# Patient Record
Sex: Male | Born: 1970 | Race: Black or African American | Hispanic: No | Marital: Married | Smoking: Never smoker
Health system: Southern US, Community
[De-identification: ages and names within clinical notes are randomized; demographics above are authoritative.]

## PROBLEM LIST (undated history)

## (undated) DIAGNOSIS — T7840XA Allergy, unspecified, initial encounter: Secondary | ICD-10-CM

## (undated) DIAGNOSIS — G4730 Sleep apnea, unspecified: Secondary | ICD-10-CM

## (undated) HISTORY — PX: WISDOM TOOTH EXTRACTION: SHX21

## (undated) HISTORY — DX: Sleep apnea, unspecified: G47.30

## (undated) HISTORY — DX: Allergy, unspecified, initial encounter: T78.40XA

## (undated) HISTORY — PX: NASAL SEPTUM SURGERY: SHX37

---

## 2014-05-19 ENCOUNTER — Emergency Department (HOSPITAL_COMMUNITY)
Admission: EM | Admit: 2014-05-19 | Discharge: 2014-05-19 | Disposition: A | Discharge status: Home / Self Care | Payer: Federal, State, Local not specified - PPO | Attending: Emergency Medicine | Admitting: Emergency Medicine

## 2014-05-19 ENCOUNTER — Encounter (HOSPITAL_COMMUNITY): Payer: Self-pay | Admitting: *Deleted

## 2014-05-19 DIAGNOSIS — M545 Low back pain, unspecified: Secondary | ICD-10-CM

## 2014-05-19 DIAGNOSIS — M546 Pain in thoracic spine: Secondary | ICD-10-CM | POA: Diagnosis not present

## 2014-05-19 MED ORDER — KETOROLAC TROMETHAMINE 60 MG/2ML IM SOLN
60.0000 mg | Freq: Once | INTRAMUSCULAR | Status: AC
  Administered 2014-05-19: 60 mg via INTRAMUSCULAR
  Filled 2014-05-19: qty 2

## 2014-05-19 MED ORDER — HYDROCODONE-ACETAMINOPHEN 5-325 MG PO TABS
1.0000 | ORAL_TABLET | ORAL | Status: DC | PRN
Start: 2014-05-19 — End: 2014-07-14

## 2014-05-19 NOTE — ED Notes (Signed)
Pt reports a 2 day Hx of MID to lower back pain .

## 2014-05-19 NOTE — ED Notes (Signed)
Declined W/C at D/C and was escorted to lobby by RN. 

## 2014-05-19 NOTE — Discharge Instructions (Signed)
Return to the emergency room with worsening of symptoms, new symptoms or with symptoms that are concerning , especially fevers, loss of control of bladder or bowels, numbness or tingling around genital region or anus, weakness. RICE: Rest, Ice (three cycles of 20 mins on, 25mins off at least twice a day), compression/brace, elevation. Heating pad works well for back pain. Ibuprofen $RemoveBeforeD'400mg'[id number]$  (2 tablets $RemoveBe'200mg'cnzhQkqrX$ ) every 5-6 hours for 3-5 days. Norco for severe pain. Do not operate machinery, drive or drink alcohol while taking narcotics or muscle relaxers. Take at night. Follow up with orthopedist if symptoms worsen or are persistent. Call or make appointment with the wellness center to establish care and have recheck of high blood pressure. Read below information and follow recommendations. Back Injury Prevention Back injuries can be extremely painful and difficult to heal. After having one back injury, you are much more likely to experience another later on. It is important to learn how to avoid injuring or re-injuring your back. The following tips can help you to prevent a back injury. PHYSICAL FITNESS  Exercise regularly and try to develop good tone in your abdominal muscles. Your abdominal muscles provide a lot of the support needed by your back.  Do aerobic exercises (walking, jogging, biking, swimming) regularly.  Do exercises that increase balance and strength (tai chi, yoga) regularly. This can decrease your risk of falling and injuring your back.  Stretch before and after exercising.  Maintain a healthy weight. The more you weigh, the more stress is placed on your back. For every pound of weight, 10 times that amount of pressure is placed on the back. DIET  Talk to your caregiver about how much calcium and vitamin D you need per day. These nutrients help to prevent weakening of the bones (osteoporosis). Osteoporosis can cause broken (fractured) bones that lead to back pain.  Include good  sources of calcium in your diet, such as dairy products, green, leafy vegetables, and products with calcium added (fortified).  Include good sources of vitamin D in your diet, such as milk and foods that are fortified with vitamin D.  Consider taking a nutritional supplement or a multivitamin if needed.  Stop smoking if you smoke. POSTURE  Sit and stand up straight. Avoid leaning forward when you sit or hunching over when you stand.  Choose chairs with good low back (lumbar) support.  If you work at a desk, sit close to your work so you do not need to lean over. Keep your chin tucked in. Keep your neck drawn back and elbows bent at a right angle. Your arms should look like the letter "L."  Sit high and close to the steering wheel when you drive. Add a lumbar support to your car seat if needed.  Avoid sitting or standing in one position for too long. Take breaks to get up, stretch, and walk around at least once every hour. Take breaks if you are driving for long periods of time.  Sleep on your side with your knees slightly bent, or sleep on your back with a pillow under your knees. Do not sleep on your stomach. LIFTING, TWISTING, AND REACHING  Avoid heavy lifting, especially repetitive lifting. If you must do heavy lifting:  Stretch before lifting.  Work slowly.  Rest between lifts.  Use carts and dollies to move objects when possible.  Make several small trips instead of carrying 1 heavy load.  Ask for help when you need it.  Ask for help when moving big, awkward  objects.  Follow these steps when lifting:  Stand with your feet shoulder-width apart.  Get as close to the object as you can. Do not try to pick up heavy objects that are far from your body.  Use handles or lifting straps if they are available.  Bend at your knees. Squat down, but keep your heels off the floor.  Keep your shoulders pulled back, your chin tucked in, and your back straight.  Lift the object  slowly, tightening the muscles in your legs, abdomen, and buttocks. Keep the object as close to the center of your body as possible.  When you put a load down, use these same guidelines in reverse.  Do not:  Lift the object above your waist.  Twist at the waist while lifting or carrying a load. Move your feet if you need to turn, not your waist.  Bend over without bending at your knees.  Avoid reaching over your head, across a table, or for an object on a high surface. OTHER TIPS  Avoid wet floors and keep sidewalks clear of ice to prevent falls.  Do not sleep on a mattress that is too soft or too hard.  Keep items that are used frequently within easy reach.  Put heavier objects on shelves at waist level and lighter objects on lower or higher shelves.  Find ways to decrease your stress, such as exercise, massage, or relaxation techniques. Stress can build up in your muscles. Tense muscles are more vulnerable to injury.  Seek treatment for depression or anxiety if needed. These conditions can increase your risk of developing back pain. SEEK MEDICAL CARE IF:  You injure your back.  You have questions about diet, exercise, or other ways to prevent back injuries. MAKE SURE YOU:  Understand these instructions.  Will watch your condition.  Will get help right away if you are not doing well or get worse. Document Released: 03/24/2004 Document Revised: 05/09/2011 Document Reviewed: 03/28/2011 [facility] Patient Information 2015 [place], [place]. This information is not intended to replace advice given to you by your health care provider. Make sure you discuss any questions you have with your health care provider.

## 2014-05-19 NOTE — ED Provider Notes (Signed)
CSN: [id number]     Arrival date & time 05/19/14  5277 History   First MD Initiated Contact with Patient 05/19/14 [phone]     Chief Complaint  Patient presents with  . Back Pain     (Consider location/radiation/quality/duration/timing/severity/associated sxs/prior Treatment) HPI  Bryan Allen is a 44 y.o. male presenting with 2 day history of bilateral mid to lower sharp back pain that exacerbated with bending over. He has been taking ibuprofen without significant relief. She reports new job with the [employer] take things could be upsetting his back. He has never had back pain before. He denies any urinary or abdominal pain. No fevers, chills, night sweats, weight loss, IVDU, history of malignancy. No loss of control of bladder or bowel. No numbness/tingling, weakness or saddle anesthesia.     History reviewed. No pertinent past medical history. History reviewed. No pertinent past surgical history. History reviewed. No pertinent family history. History  Substance Use Topics  . Smoking status: Never Smoker   . Smokeless tobacco: Never Used  . Alcohol Use: No    Review of Systems  Constitutional: Negative for fever and chills.  Gastrointestinal: Negative for nausea and vomiting.  Genitourinary: Negative for dysuria, urgency, frequency and decreased urine volume.  Musculoskeletal: Positive for back pain. Negative for gait problem.  Neurological: Negative for weakness and numbness.      Allergies  Review of patient's allergies indicates not on file.  Home Medications   Prior to Admission medications   Not on File   BP 143/100 mmHg  Pulse 72  Temp(Src) 97.6 F (36.4 C) (Oral)  Resp 18  Ht 5\' 8"  (1.727 m)  Wt 236 lb (107.049 kg)  BMI 35.89 kg/m2  SpO2 96% Physical Exam  Constitutional: He appears well-developed and well-nourished. No distress.  HENT:  Head: Normocephalic and atraumatic.  Eyes: Conjunctivae are normal. Right eye exhibits no discharge. Left eye  exhibits no discharge.  Cardiovascular: Normal rate, regular rhythm and normal heart sounds.   Pulmonary/Chest: Effort normal and breath sounds normal. No respiratory distress. He has no wheezes.  Abdominal: Soft. Bowel sounds are normal. He exhibits no distension. There is no tenderness.  Musculoskeletal:  No midline back tenderness, step off or crepitus. Right and Left sided lower and mid thoracic back tenderness. No CVA tenderness.  Neurological: He is alert. Coordination normal.  Equal muscle tone. 5/5 strength in lower extremities. DTR equal and intact. Negative straight leg test. Normal gait.  Skin: Skin is warm and dry. He is not diaphoretic.  Nursing note and vitals reviewed.   ED Course  Procedures (including critical care time) Labs Review Labs Reviewed - No data to display  Imaging Review No results found.   EKG Interpretation None      MDM   Final diagnoses:  None   Patient presenting with 2 day history of sharp back pain exacerbated with movement after new job with the [employer]. VSS. Normal neurological exam and patient ambulatory. At presentation and exam consistent with cauda equina. Patient encouraged to use ibuprofen rice protocol as well as Norco prescription. Driving and sedation precautions provided. Patient also with initial hypertension. Likely related to his pain. He's been given a referral to wellness center to establish care and recheck for blood pressure.  Discussed return precautions with patient. Discussed all results and patient verbalizes understanding and agrees with plan.     Al Corpus, PA-C 05/19/14 3536  Evelina Bucy, MD 05/19/14 0900

## 2014-07-14 ENCOUNTER — Encounter: Payer: Self-pay | Admitting: Medical

## 2014-07-14 ENCOUNTER — Ambulatory Visit (INDEPENDENT_AMBULATORY_CARE_PROVIDER_SITE_OTHER): Payer: Federal, State, Local not specified - PPO | Admitting: Medical

## 2014-07-14 VITALS — BP 118/76 | HR 71 | Temp 98.5°F | Ht 67.75 in | Wt 230.2 lb

## 2014-07-14 DIAGNOSIS — Z125 Encounter for screening for malignant neoplasm of prostate: Secondary | ICD-10-CM

## 2014-07-14 DIAGNOSIS — Z8042 Family history of malignant neoplasm of prostate: Secondary | ICD-10-CM

## 2014-07-14 DIAGNOSIS — Z23 Encounter for immunization: Secondary | ICD-10-CM

## 2014-07-14 DIAGNOSIS — Z0000 Encounter for general adult medical examination without abnormal findings: Secondary | ICD-10-CM | POA: Diagnosis not present

## 2014-07-14 LAB — CBC
HCT: 43 % (ref 39.0–52.0)
Hemoglobin: 15.2 g/dL (ref 13.0–17.0)
MCH: 29.5 pg (ref 26.0–34.0)
MCHC: 35.3 g/dL (ref 30.0–36.0)
MCV: 83.3 fL (ref 78.0–100.0)
MPV: 10.1 fL (ref 8.6–12.4)
Platelets: 192 10*3/uL (ref 150–400)
RBC: 5.16 MIL/uL (ref 4.22–5.81)
RDW: 14.2 % (ref 11.5–15.5)
WBC: 5 10*3/uL (ref 4.0–10.5)

## 2014-07-14 LAB — COMPREHENSIVE METABOLIC PANEL
ALT: 71 U/L — AB (ref 0–53)
AST: 33 U/L (ref 0–37)
Albumin: 4.7 g/dL (ref 3.5–5.2)
Alkaline Phosphatase: 49 U/L (ref 39–117)
BILIRUBIN TOTAL: 0.4 mg/dL (ref 0.2–1.2)
BUN: 17 mg/dL (ref 6–23)
CO2: 25 mEq/L (ref 19–32)
Calcium: 9.7 mg/dL (ref 8.4–10.5)
Chloride: 105 mEq/L (ref 96–112)
Creat: 1.14 mg/dL (ref 0.50–1.35)
Glucose, Bld: 92 mg/dL (ref 70–99)
POTASSIUM: 4.4 meq/L (ref 3.5–5.3)
SODIUM: 141 meq/L (ref 135–145)
Total Protein: 7.6 g/dL (ref 6.0–8.3)

## 2014-07-14 LAB — LIPID PANEL
Cholesterol: 186 mg/dL (ref 0–200)
HDL: 35 mg/dL — AB (ref >=40)
LDL CALC: 130 mg/dL — AB (ref 0–99)
TRIGLYCERIDES: 104 mg/dL (ref <150)
Total CHOL/HDL Ratio: 5.3 Ratio
VLDL: 21 mg/dL (ref 0–40)

## 2014-07-14 LAB — HEMOGLOBIN A1C
Hgb A1c MFr Bld: 5.8 % — ABNORMAL HIGH (ref <5.7)
Mean Plasma Glucose: 120 mg/dL — ABNORMAL HIGH (ref <117)

## 2014-07-14 NOTE — Progress Notes (Signed)
Subjective:   HPI  Bryan Allen is a 44 y.o. male who presents for a complete physical.  Just moved from [place] to [facility] in February    Preventative care: Last ophthalmology visit:  2 weeks ago Last dental visit: a year ago Last colonoscopy: never Last prostate exam: 2 years ago Last EKG: never Last labs: a year ago  Prior vaccinations: TD or Tdap: unsure Influenza: never Pneumococcal: never  Advanced directive: Health care power of attorney: no Living will: no  Reviewed their medical, surgical, family, social, medication, and allergy history and updated chart as appropriate.  No past medical history on file.  Past Surgical History  Procedure Laterality Date  . Nasal septum surgery      History   Social History  . Marital Status: Married    Spouse Name: N/A  . Number of Children: N/A  . Years of Education: N/A   Occupational History  . Not on file.   Social History Main Topics  . Smoking status: Never Smoker   . Smokeless tobacco: Never Used  . Alcohol Use: No  . Drug Use: No  . Sexual Activity: Not on file   Other Topics Concern  . 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  Wants to start riding back and forth to work.    Family History  Problem Relation Age of Onset  . Cancer Mother     unsure of type  . Pneumonia Father 83    died of pneumonia  . Cancer Father     prostate in 67s  . Other Sister     died in [place]  . Heart disease Brother     congenital   . Asthma Daughter   . Asthma Son   . Diabetes Neg Hx   . Stroke Neg Hx     No current outpatient prescriptions on file.  No Known Allergies   Review of Systems Constitutional: -fever, -chills, -sweats, -unexpected weight change, -decreased appetite, -fatigue Allergy: -sneezing, -itching, -congestion Dermatology: -changing moles, --rash, -lumps ENT:  -runny nose, -ear pain, -sore throat, -hoarseness, -sinus pain, -teeth pain, - ringing in ears, -hearing loss, -nosebleeds Cardiology: -chest pain, -palpitations, -swelling, -difficulty breathing when lying flat, -waking up short of breath Respiratory: -cough, -shortness of breath, -difficulty breathing with exercise or exertion, -wheezing, -coughing up blood Gastroenterology: -abdominal pain, -nausea, -vomiting, -diarrhea, -constipation, -blood in stool, -changes in bowel movement, -difficulty swallowing or eating Hematology: -bleeding, -bruising  Musculoskeletal: -joint aches, -muscle aches, -joint swelling, -back pain, -neck pain, -cramping, -changes in gait Ophthalmology: denies vision changes, eye redness, itching, discharge Urology: -burning with urination, -difficulty urinating, -blood in urine, -urinary frequency, -urgency, -incontinence Neurology: -headache, -weakness, -tingling, -numbness, -memory loss, -falls, -dizziness Psychology: -depressed mood, -agitation, -sleep problems     Objective:   Physical Exam  BP 118/76 mmHg  Pulse 71  Temp(Src) 98.5 F (36.9 C) (Oral)  Ht 5' 7.75" (1.721 m)  Wt 230 lb 3.2 oz (104.418 kg)  BMI 35.25 kg/m2  General appearance: alert, no distress, WD/WN, AA male, muscular build Skin:left upper back with brown raised raisin appearing lesion suggestive of seb derm, otherwise few scattered benign macules, no worrisome lesions HEENT: normocephalic, conjunctiva/corneas normal, sclerae anicteric, PERRLA, EOMi, nares patent, no discharge or erythema, pharynx normal Oral cavity: MMM, tongue normal, teeth in good repair Neck: supple, no lymphadenopathy, no thyromegaly, no masses,  normal ROM, no bruits Chest: non tender, normal shape and expansion Heart: RRR, normal S1, S2, no murmurs Lungs: CTA bilaterally, no wheezes, rhonchi, or rales Abdomen: +bs, soft, non tender, non distended, no masses, no hepatomegaly, no splenomegaly, no bruits Back: non  tender, normal ROM, no scoliosis Musculoskeletal: upper extremities non tender, no obvious deformity, normal ROM throughout, lower extremities non tender, no obvious deformity, normal ROM throughout Extremities: no edema, no cyanosis, no clubbing Pulses: 2+ symmetric, upper and lower extremities, normal cap refill Neurological: alert, oriented x 3, CN2-12 intact, strength normal upper extremities and lower extremities, sensation normal throughout, DTRs 2+ throughout, no cerebellar signs, gait normal Psychiatric: normal affect, behavior normal, pleasant  GU: normal male external genitalia, circumcised, nontender, no masses, no hernia, no lymphadenopathy Rectal: anus normal tone, prosate WNL   Assessment and Plan :    Encounter Diagnoses  Name Primary?  . Encounter for health maintenance examination in adult Yes  . Screening for prostate cancer   . Family history of prostate cancer   . Need for Tdap vaccination     Physical exam - discussed healthy lifestyle, diet, exercise, preventative care, vaccinations, and addressed their concerns.   See your eye doctor yearly for routine vision care. See your dentist yearly for routine dental care including hygiene visits twice yearly. discussed risks/benefits of prostate screening.    Counseled on the Tdap (tetanus, diptheria, and acellular pertussis) vaccine.  Vaccine information sheet given. Tdap vaccine given after consent obtained. Follow-up pending labs

## 2014-07-14 NOTE — Patient Instructions (Signed)
Thank you for giving me the opportunity to serve you today.   Your diagnoses today includes: Encounter Diagnoses  Name Primary?  . Encounter for health maintenance examination in adult Yes  . Screening for prostate cancer   . Family history of prostate cancer   . Need for Tdap vaccination     Specific recommendations today include Exercise most days per week, preferably at least 30 minutes every day Eat healthy, work on some weight loss  Vaccine recommendations: Yearly flu vaccine We updated your Tdap - tetanus, diptheria and pertussis vaccine today  Please follow up pending labs  I have included other useful information below for your review.  Dr. Jonna Coup, dentist [address] [place], [place] [address] [phone] [email or web address]      Preventative Care for Adults, Male       REGULAR HEALTH EXAMS:  A routine yearly physical is a good way to check in with your primary care provider about your health and preventive screening. It is also an opportunity to share updates about your health and any concerns you have, and receive a thorough all-over exam.   Most health insurance companies pay for at least some preventative services.  Check with your health plan for specific coverages.  WHAT PREVENTATIVE SERVICES DO MEN NEED?  Adult men should have their weight and blood pressure checked regularly.   Men age 25 and older should have their cholesterol levels checked regularly.  Beginning at age 7 and continuing to age 53, men should be screened for colorectal cancer.  Certain people should may need continued testing until age 24.  Other cancer screening may include exams for testicular and prostate cancer.  Updating vaccinations is part of preventative care.  Vaccinations help protect against diseases such as the flu.  Lab tests are generally done as part of preventative care to screen for anemia and blood disorders, to screen for problems with the kidneys and  liver, to screen for bladder problems, to check blood sugar, and to check your cholesterol level.  Preventative services generally include counseling about diet, exercise, avoiding tobacco, drugs, excessive alcohol consumption, and sexually transmitted infections.    GENERAL RECOMMENDATIONS FOR GOOD HEALTH:  Healthy diet:  Eat a variety of foods, including fruit, vegetables, animal or vegetable protein, such as meat, fish, chicken, and eggs, or beans, lentils, tofu, and grains, such as rice.  Drink plenty of water daily.  Decrease saturated fat in the diet, avoid lots of red meat, processed foods, sweets, fast foods, and fried foods.  Exercise:  Aerobic exercise helps maintain good heart health. At least 30-40 minutes of moderate-intensity exercise is recommended. For example, a brisk walk that increases your heart rate and breathing. This should be done on most days of the week.   Find a type of exercise or a variety of exercises that you enjoy so that it becomes a part of your daily life.  Examples are running, walking, swimming, water aerobics, and biking.  For motivation and support, explore group exercise such as aerobic class, spin class, Zumba, Yoga,or  martial arts, etc.    Set exercise goals for yourself, such as a certain weight goal, walk or run in a race such as a 5k walk/run.  Speak to your primary care provider about exercise goals.  Disease prevention:  If you smoke or chew tobacco, find out from your caregiver how to quit. It can literally save your life, no matter how long you have been a tobacco user. If you  do not use tobacco, never begin.   Maintain a healthy diet and normal weight. Increased weight leads to problems with blood pressure and diabetes.   The Body Mass Index or BMI is a way of measuring how much of your body is fat. Having a BMI above 27 increases the risk of heart disease, diabetes, hypertension, stroke and other problems related to obesity. Your  caregiver can help determine your BMI and based on it develop an exercise and dietary program to help you achieve or maintain this important measurement at a healthful level.  High blood pressure causes heart and blood vessel problems.  Persistent high blood pressure should be treated with medicine if weight loss and exercise do not work.   Fat and cholesterol leaves deposits in your arteries that can block them. This causes heart disease and vessel disease elsewhere in your body.  If your cholesterol is found to be high, or if you have heart disease or certain other medical conditions, then you may need to have your cholesterol monitored frequently and be treated with medication.   Ask if you should have a stress test if your history suggests this. A stress test is a test done on a treadmill that looks for heart disease. This test can find disease prior to there being a problem.  Avoid drinking alcohol in excess (more than two drinks per day).  Avoid use of street drugs. Do not share needles with anyone. Ask for professional help if you need assistance or instructions on stopping the use of alcohol, cigarettes, and/or drugs.  Brush your teeth twice a day with fluoride toothpaste, and floss once a day. Good oral hygiene prevents tooth decay and gum disease. The problems can be painful, unattractive, and can cause other health problems. Visit your dentist for a routine oral and dental check up and preventive care every 6-12 months.   Look at your skin regularly.  Use a mirror to look at your back. Notify your caregivers of changes in moles, especially if there are changes in shapes, colors, a size larger than a pencil eraser, an irregular border, or development of new moles.  Safety:  Use seatbelts 100% of the time, whether driving or as a passenger.  Use safety devices such as hearing protection if you work in environments with loud noise or significant background noise.  Use safety glasses when  doing any work that could send debris in to the eyes.  Use a helmet if you ride a bike or motorcycle.  Use appropriate safety gear for contact sports.  Talk to your caregiver about gun safety.  Use sunscreen with a SPF (or skin protection factor) of 15 or greater.  Lighter skinned people are at a greater risk of skin cancer. Don't forget to also wear sunglasses in order to protect your eyes from too much damaging sunlight. Damaging sunlight can accelerate cataract formation.   Practice safe sex. Use condoms. Condoms are used for birth control and to help reduce the spread of sexually transmitted infections (or STIs).  Some of the STIs are gonorrhea (the clap), chlamydia, syphilis, trichomonas, herpes, HPV (human papilloma virus) and HIV (human immunodeficiency virus) which causes AIDS. The herpes, HIV and HPV are viral illnesses that have no cure. These can result in disability, cancer and death.   Keep carbon monoxide and smoke detectors in your home functioning at all times. Change the batteries every 6 months or use a model that plugs into the wall.   Vaccinations:  Stay  up to date with your tetanus shots and other required immunizations. You should have a booster for tetanus every 10 years. Be sure to get your flu shot every year, since 5%-20% of the [place] population comes down with the flu. The flu vaccine changes each year, so being vaccinated once is not enough. Get your shot in the fall, before the flu season peaks.   Other vaccines to consider:  Pneumococcal vaccine to protect against certain types of pneumonia.  This is normally recommended for adults age 12 or older.  However, adults younger than 45 years old with certain underlying conditions such as diabetes, heart or lung disease should also receive the vaccine.  Shingles vaccine to protect against Varicella Zoster if you are older than age 61, or younger than 45 years old with certain underlying illness.  Hepatitis A vaccine to protect  against a form of infection of the liver by a virus acquired from food.  Hepatitis B vaccine to protect against a form of infection of the liver by a virus acquired from blood or body fluids, particularly if you work in health care.  If you plan to travel internationally, check with your local health department for specific vaccination recommendations.  Cancer Screening:  Most routine colon cancer screening begins at the age of 75. On a yearly basis, doctors may provide special easy to use take-home tests to check for hidden blood in the stool. Sigmoidoscopy or colonoscopy can detect the earliest forms of colon cancer and is life saving. These tests use a small camera at the end of a tube to directly examine the colon. Speak to your caregiver about this at age 22, when routine screening begins (and is repeated every 5 years unless early forms of pre-cancerous polyps or small growths are found).   At the age of 95 men usually start screening for prostate cancer every year. Screening may begin at a younger age for those with higher risk. Those at higher risk include African-Americans or having a family history of prostate cancer. There are two types of tests for prostate cancer:   Prostate-specific antigen (PSA) testing. Recent studies raise questions about prostate cancer using PSA and you should discuss this with your caregiver.   Digital rectal exam (in which your doctor's lubricated and gloved finger feels for enlargement of the prostate through the anus).   Screening for testicular cancer.  Do a monthly exam of your testicles. Gently roll each testicle between your thumb and fingers, feeling for any abnormal lumps. The best time to do this is after a hot shower or bath when the tissues are looser. Notify your caregivers of any lumps, tenderness or changes in size or shape immediately.

## 2014-07-15 LAB — PSA: PSA: 0.59 ng/mL (ref <=4.00)

## 2014-08-14 ENCOUNTER — Ambulatory Visit (INDEPENDENT_AMBULATORY_CARE_PROVIDER_SITE_OTHER): Payer: Federal, State, Local not specified - PPO | Admitting: Medical

## 2014-08-14 ENCOUNTER — Encounter: Payer: Self-pay | Admitting: Medical

## 2014-08-14 VITALS — BP 134/94 | HR 80 | Resp 16 | Wt 218.0 lb

## 2014-08-14 DIAGNOSIS — I10 Essential (primary) hypertension: Secondary | ICD-10-CM | POA: Diagnosis not present

## 2014-08-14 NOTE — Patient Instructions (Signed)
DASH Eating Plan DASH stands for "Dietary Approaches to Stop Hypertension." The DASH eating plan is a healthy eating plan that has been shown to reduce high blood pressure (hypertension). Additional health benefits may include reducing the risk of type 2 diabetes mellitus, heart disease, and stroke. The DASH eating plan may also help with weight loss. WHAT DO I NEED TO KNOW ABOUT THE DASH EATING PLAN? For the DASH eating plan, you will follow these general guidelines:  Choose foods with a percent daily value for sodium of less than 5% (as listed on the food label).  Use salt-free seasonings or herbs instead of table salt or sea salt.  Check with your health care provider or pharmacist before using salt substitutes.  Eat lower-sodium products, often labeled as "lower sodium" or "no salt added."  Eat fresh foods.  Eat more vegetables, fruits, and low-fat dairy products.  Choose whole grains. Look for the word "whole" as the first word in the ingredient list.  Choose fish and skinless chicken or turkey more often than red meat. Limit fish, poultry, and meat to 6 oz (170 g) each day.  Limit sweets, desserts, sugars, and sugary drinks.  Choose heart-healthy fats.  Limit cheese to 1 oz (28 g) per day.  Eat more home-cooked food and less restaurant, buffet, and fast food.  Limit fried foods.  Cook foods using methods other than frying.  Limit canned vegetables. If you do use them, rinse them well to decrease the sodium.  When eating at a restaurant, ask that your food be prepared with less salt, or no salt if possible. WHAT FOODS CAN I EAT? Seek help from a dietitian for individual calorie needs. Grains Whole grain or whole wheat bread. Brown rice. Whole grain or whole wheat pasta. Quinoa, bulgur, and whole grain cereals. Low-sodium cereals. Corn or whole wheat flour tortillas. Whole grain cornbread. Whole grain crackers. Low-sodium crackers. Vegetables Fresh or frozen vegetables  (raw, steamed, roasted, or grilled). Low-sodium or reduced-sodium tomato and vegetable juices. Low-sodium or reduced-sodium tomato sauce and paste. Low-sodium or reduced-sodium canned vegetables.  Fruits All fresh, canned (in natural juice), or frozen fruits. Meat and Other Protein Products Ground beef (85% or leaner), grass-fed beef, or beef trimmed of fat. Skinless chicken or turkey. Ground chicken or turkey. Pork trimmed of fat. All fish and seafood. Eggs. Dried beans, peas, or lentils. Unsalted nuts and seeds. Unsalted canned beans. Dairy Low-fat dairy products, such as skim or 1% milk, 2% or reduced-fat cheeses, low-fat ricotta or cottage cheese, or plain low-fat yogurt. Low-sodium or reduced-sodium cheeses. Fats and Oils Tub margarines without trans fats. Light or reduced-fat mayonnaise and salad dressings (reduced sodium). Avocado. Safflower, olive, or canola oils. Natural peanut or almond butter. Other Unsalted popcorn and pretzels. The items listed above may not be a complete list of recommended foods or beverages. Contact your dietitian for more options. WHAT FOODS ARE NOT RECOMMENDED? Grains White bread. White pasta. White rice. Refined cornbread. Bagels and croissants. Crackers that contain trans fat. Vegetables Creamed or fried vegetables. Vegetables in a cheese sauce. Regular canned vegetables. Regular canned tomato sauce and paste. Regular tomato and vegetable juices. Fruits Dried fruits. Canned fruit in light or heavy syrup. Fruit juice. Meat and Other Protein Products Fatty cuts of meat. Ribs, chicken wings, bacon, sausage, bologna, salami, chitterlings, fatback, hot dogs, bratwurst, and packaged luncheon meats. Salted nuts and seeds. Canned beans with salt. Dairy Whole or 2% milk, cream, half-and-half, and cream cheese. Whole-fat or sweetened yogurt. Full-fat   cheeses or blue cheese. Nondairy creamers and whipped toppings. Processed cheese, cheese spreads, or cheese  curds. Condiments Onion and garlic salt, seasoned salt, table salt, and sea salt. Canned and packaged gravies. Worcestershire sauce. Tartar sauce. Barbecue sauce. Teriyaki sauce. Soy sauce, including reduced sodium. Steak sauce. Fish sauce. Oyster sauce. Cocktail sauce. Horseradish. Ketchup and mustard. Meat flavorings and tenderizers. Bouillon cubes. Hot sauce. Tabasco sauce. Marinades. Taco seasonings. Relishes. Fats and Oils Butter, stick margarine, lard, shortening, ghee, and bacon fat. Coconut, palm kernel, or palm oils. Regular salad dressings. Other Pickles and olives. Salted popcorn and pretzels. The items listed above may not be a complete list of foods and beverages to avoid. Contact your dietitian for more information. WHERE CAN I FIND MORE INFORMATION? National Heart, Lung, and Blood Institute: www.nhlbi.nih.gov/health/health-topics/topics/dash/ Document Released: 02/03/2011 Document Revised: 07/01/2013 Document Reviewed: 12/19/2012 ExitCare Patient Information 2015 [employer]. This information is not intended to replace advice given to you by your health care provider. Make sure you discuss any questions you have with your health care provider. Hypertension Hypertension, commonly called high blood pressure, is when the force of blood pumping through your arteries is too strong. Your arteries are the blood vessels that carry blood from your heart throughout your body. A blood pressure reading consists of a higher number over a lower number, such as 110/72. The higher number (systolic) is the pressure inside your arteries when your heart pumps. The lower number (diastolic) is the pressure inside your arteries when your heart relaxes. Ideally you want your blood pressure below 120/80. Hypertension forces your heart to work harder to pump blood. Your arteries may become narrow or stiff. Having hypertension puts you at risk for heart disease, stroke, and other problems.  RISK  FACTORS Some risk factors for high blood pressure are controllable. Others are not.  Risk factors you cannot control include:   Race. You may be at higher risk if you are African American.  Age. Risk increases with age.  Gender. Men are at higher risk than women before age 45 years. After age 65, women are at higher risk than men. Risk factors you can control include:  Not getting enough exercise or physical activity.  Being overweight.  Getting too much fat, sugar, calories, or salt in your diet.  Drinking too much alcohol. SIGNS AND SYMPTOMS Hypertension does not usually cause signs or symptoms. Extremely high blood pressure (hypertensive crisis) may cause headache, anxiety, shortness of breath, and nosebleed. DIAGNOSIS  To check if you have hypertension, your health care provider will measure your blood pressure while you are seated, with your arm held at the level of your heart. It should be measured at least twice using the same arm. Certain conditions can cause a difference in blood pressure between your right and left arms. A blood pressure reading that is higher than normal on one occasion does not mean that you need treatment. If one blood pressure reading is high, ask your health care provider about having it checked again. TREATMENT  Treating high blood pressure includes making lifestyle changes and possibly taking medicine. Living a healthy lifestyle can help lower high blood pressure. You may need to change some of your habits. Lifestyle changes may include:  Following the DASH diet. This diet is high in fruits, vegetables, and whole grains. It is low in salt, red meat, and added sugars.  Getting at least 2 hours of brisk physical activity every week.  Losing weight if necessary.  Not smoking.  Limiting   alcoholic beverages.  Learning ways to reduce stress. If lifestyle changes are not enough to get your blood pressure under control, your health care provider may  prescribe medicine. You may need to take more than one. Work closely with your health care provider to understand the risks and benefits. HOME CARE INSTRUCTIONS  Have your blood pressure rechecked as directed by your health care provider.   Take medicines only as directed by your health care provider. Follow the directions carefully. Blood pressure medicines must be taken as prescribed. The medicine does not work as well when you skip doses. Skipping doses also puts you at risk for problems.   Do not smoke.   Monitor your blood pressure at home as directed by your health care provider. SEEK MEDICAL CARE IF:   You think you are having a reaction to medicines taken.  You have recurrent headaches or feel dizzy.  You have swelling in your ankles.  You have trouble with your vision. SEEK IMMEDIATE MEDICAL CARE IF:  You develop a severe headache or confusion.  You have unusual weakness, numbness, or feel faint.  You have severe chest or abdominal pain.  You vomit repeatedly.  You have trouble breathing. MAKE SURE YOU:   Understand these instructions.  Will watch your condition.  Will get help right away if you are not doing well or get worse. Document Released: 02/14/2005 Document Revised: 07/01/2013 Document Reviewed: 12/07/2012 ExitCare Patient Information 2015 [employer]. This information is not intended to replace advice given to you by your health care provider. Make sure you discuss any questions you have with your health care provider.  

## 2014-08-14 NOTE — Progress Notes (Signed)
Subjective: Here for ongoing BP concerns.  I saw him back in May as a new patient.  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  Since last visit here he has lost some weight through more careful diet and exercise.   He is being careful with salt, doesn't like the idea of having high blood pressure.   He has checked BP from time to time at [facility] and [facility], gets normal reading and sometimes gets 90s on DBP.   He is not convinced he has hypertension, worried about ED and potential erection issues if he has to go on medication.  No family hx/o HTN.   He denies any symptoms, no chest pain, vision changes, headaches, urinary changes, edema, palpations.  Doing fine otherwise.  No other aggravating or relieving factors. No other complaint.  Objective: BP 134/94 mmHg  Pulse 80  Resp 16  Wt 218 lb (98.884 kg)  General appearance: alert, no distress, WD/WN Neck: supple, no lymphadenopathy, no thyromegaly, no masses Heart: RRR, normal S1, S2, no murmurs Lungs: CTA bilaterally, no wheezes, rhonchi, or rales Abdomen: +bs, soft, non tender, non distended, no masses, no hepatomegaly, no splenomegaly Pulses: 2+ symmetric, upper and lower extremities, normal cap refill Ext: no edema    Adult ECG Report  Indication: elevated BPs  Rate: 74 bpm  Rhythm: normal sinus rhythm  QRS Axis: 68 degrees  PR Interval: 112ms  QRS Duration: 51ms  QTc: 46ms  Conduction Disturbances: T wave inversion V2 only  Other Abnormalities: none  Patient's cardiac risk factors are: hypertension and male gender.  EKG comparison: none  Narrative Interpretation: nonspecific T wave abnormality, otherwise normal EKG    Assessment: Encounter Diagnosis  Name Primary?  . Essential hypertension Yes    Plan: We discussed diagnosis or likely diagnosis of hypertension. He has had some normal and some abnormal recent readings, about half each.   He declines medication today.  Will check BPs at drug  store, and return readings in 1-2 wk.   If DBP consistently in the 90s, will then need to begin medication.   C/t healthy diet, exercise, and f/u pending BP numbers.   Consider ARB vs CCB such as amlodipine.

## 2014-09-03 ENCOUNTER — Ambulatory Visit: Payer: Federal, State, Local not specified - PPO | Admitting: Medical

## 2014-09-15 ENCOUNTER — Encounter: Payer: Self-pay | Admitting: Medical

## 2014-09-15 ENCOUNTER — Other Ambulatory Visit: Payer: Self-pay | Admitting: Medical

## 2014-09-15 ENCOUNTER — Ambulatory Visit: Payer: Federal, State, Local not specified - PPO | Admitting: Medical

## 2014-09-15 ENCOUNTER — Ambulatory Visit (INDEPENDENT_AMBULATORY_CARE_PROVIDER_SITE_OTHER): Payer: Federal, State, Local not specified - PPO | Admitting: Medical

## 2014-09-15 VITALS — BP 132/88 | HR 69 | Temp 98.0°F | Resp 15 | Wt 214.0 lb

## 2014-09-15 DIAGNOSIS — R7989 Other specified abnormal findings of blood chemistry: Secondary | ICD-10-CM | POA: Diagnosis not present

## 2014-09-15 DIAGNOSIS — R945 Abnormal results of liver function studies: Secondary | ICD-10-CM

## 2014-09-15 DIAGNOSIS — R030 Elevated blood-pressure reading, without diagnosis of hypertension: Secondary | ICD-10-CM | POA: Diagnosis not present

## 2014-09-15 DIAGNOSIS — E786 Lipoprotein deficiency: Secondary | ICD-10-CM | POA: Diagnosis not present

## 2014-09-15 LAB — HEPATIC FUNCTION PANEL
ALK PHOS: 37 U/L — AB (ref 39–117)
ALT: 53 U/L (ref 0–53)
AST: 33 U/L (ref 0–37)
Albumin: 4.4 g/dL (ref 3.5–5.2)
BILIRUBIN DIRECT: 0.1 mg/dL (ref 0.0–0.3)
BILIRUBIN TOTAL: 0.5 mg/dL (ref 0.2–1.2)
Indirect Bilirubin: 0.4 mg/dL (ref 0.2–1.2)
Total Protein: 6.9 g/dL (ref 6.0–8.3)

## 2014-09-15 LAB — LIPID PANEL
Cholesterol: 148 mg/dL (ref 0–200)
HDL: 46 mg/dL (ref >=40)
LDL Cholesterol: 92 mg/dL (ref 0–99)
TRIGLYCERIDES: 49 mg/dL (ref <150)
Total CHOL/HDL Ratio: 3.2 Ratio
VLDL: 10 mg/dL (ref 0–40)

## 2014-09-15 NOTE — Progress Notes (Signed)
Subjective: Here for f/u on mildly elevated liver tests from physical back in 06/2014.   No heavy alcohol use, no hx/o hepatitis, no other concerns for liver disease prior.    Here for f/u on HDL low.  Since physical commutes back and forth to work on bike .   Wants to recheck labs today.   Been eating healthy.   Here for BP f/u.  Since last visit he brings in his list of home readings and they are all 120- 130s SBP, 75-80s DBP with 1 reading of 94 DBP.  Had DOT physical recently and was given 16mo extension through August.   He has only had a few elevated readings at [facility].  He denies any symptoms, no chest pain, vision changes, headaches, urinary changes, edema, palpations.  Doing fine otherwise.  No other aggravating or relieving factors. No other complaint.  History reviewed. No pertinent past medical history.  Family History  Problem Relation Age of Onset  . Cancer Mother     unsure of type  . Hypertension Mother   . Pneumonia Father 45    died of pneumonia  . Cancer Father     prostate in 35s  . Hypertension Father   . Other Sister     died in [place]  . Heart disease Brother     congenital   . Hypertension Brother   . Asthma Daughter   . Asthma Son   . Diabetes Neg Hx   . Stroke Neg Hx     Objective: BP 132/88 mmHg  Pulse 69  Temp(Src) 98 F (36.7 C) (Oral)  Resp 15  Wt 214 lb (97.07 kg)  General appearance: alert, no distress, WD/WN Neck: supple, no lymphadenopathy, no thyromegaly, no masses Heart: RRR, normal S1, S2, no murmurs Lungs: CTA bilaterally, no wheezes, rhonchi, or rales Abdomen: +bs, soft, non tender, non distended, no masses, no hepatomegaly, no splenomegaly Pulses: 2+ symmetric, upper and lower extremities, normal cap refill Ext: no edema    Assessment: Encounter Diagnoses  Name Primary?  . Elevated blood-pressure reading without diagnosis of hypertension Yes  . Elevated LFTs   . Low HDL (under 40)     Plan: Elevated BPs.  I suspect  a large component of white coat hypertension.   He has normal home readings, both normal and mildly elevated DBP here.  At this point he does not appear to have definite diagnosis of hypertension.  He does have family history.  We discussed the possibility of that diagnosis moving forward though.  Advised he get a home cuff and check BP periodically. Discussed normal vs abnormal readings, when to recheck if numbers are staying elevated.  C/t routine care, c/t physical yearly.   Discussed complications of uncontrolled BP.   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     Labs today for recheck on elevated liver test and low HDL from 06/2014.  C/t healthy diet, routine exercise

## 2014-12-10 ENCOUNTER — Telehealth: Payer: Self-pay | Admitting: Medical

## 2014-12-10 NOTE — Telephone Encounter (Signed)
Pt called for labs results, mailed copy and gave Shane's instructions

## 2015-03-26 ENCOUNTER — Other Ambulatory Visit (INDEPENDENT_AMBULATORY_CARE_PROVIDER_SITE_OTHER): Payer: Federal, State, Local not specified - PPO

## 2015-03-26 ENCOUNTER — Telehealth: Payer: Self-pay | Admitting: *Deleted

## 2015-03-26 VITALS — BP 130/80 | HR 72

## 2015-03-26 DIAGNOSIS — R079 Chest pain, unspecified: Secondary | ICD-10-CM

## 2015-03-26 NOTE — Telephone Encounter (Signed)
Just to let you know that patient came in for nurse visit to have bp meter checked against ours. His first readings were in the 150's-160's over 100's-110. He was complaining of some left sided chest pain while here for nurse visit. Said that he was having the pain x 2 days. No SOB, no numbness or tingling, no other symptoms at all when asked. He did go to work both days without any problem. 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 I checked with Dr.Knapp and she had me do an EKG. It was totally normal and he was allowed to leave. He felt reassured and said he felt much better. He was advised to call and follow up with you if he is still having symptoms tomorrow or early next week. I will put his EKG on your desk for you to take a look at. Dr.Knapp did review and sign off on. Thanks.

## 2015-03-27 NOTE — Telephone Encounter (Signed)
No the readings I got were 130/100 and 136/110 after I let him sit for 5-10 minutes

## 2015-03-27 NOTE — Telephone Encounter (Signed)
Did his readings correspond with ours correctly?  In other words were our readings similar and also high?

## 2015-03-27 NOTE — Telephone Encounter (Signed)
Ok, then have him come in for consult on BP, chest pain, medications.

## 2015-03-27 NOTE — Telephone Encounter (Signed)
LM for pts wife to have him give me a call because the number listed for him is not correct

## 2015-03-30 NOTE — Telephone Encounter (Signed)
Sent letter

## 2015-12-07 ENCOUNTER — Encounter: Payer: Federal, State, Local not specified - PPO | Admitting: Medical

## 2016-01-12 ENCOUNTER — Emergency Department (HOSPITAL_COMMUNITY)
Admission: EM | Admit: 2016-01-12 | Discharge: 2016-01-12 | Disposition: A | Discharge status: Home / Self Care | Payer: Federal, State, Local not specified - PPO | Attending: Emergency Medicine | Admitting: Emergency Medicine

## 2016-01-12 ENCOUNTER — Telehealth: Payer: Self-pay | Admitting: Medical

## 2016-01-12 ENCOUNTER — Encounter (HOSPITAL_COMMUNITY): Payer: Self-pay

## 2016-01-12 DIAGNOSIS — T148XXA Other injury of unspecified body region, initial encounter: Secondary | ICD-10-CM

## 2016-01-12 DIAGNOSIS — Z23 Encounter for immunization: Secondary | ICD-10-CM | POA: Insufficient documentation

## 2016-01-12 DIAGNOSIS — W228XXA Striking against or struck by other objects, initial encounter: Secondary | ICD-10-CM | POA: Diagnosis not present

## 2016-01-12 DIAGNOSIS — S80811A Abrasion, right lower leg, initial encounter: Secondary | ICD-10-CM | POA: Insufficient documentation

## 2016-01-12 DIAGNOSIS — Y9289 Other specified places as the place of occurrence of the external cause: Secondary | ICD-10-CM | POA: Insufficient documentation

## 2016-01-12 DIAGNOSIS — S8991XA Unspecified injury of right lower leg, initial encounter: Secondary | ICD-10-CM | POA: Diagnosis present

## 2016-01-12 DIAGNOSIS — Y999 Unspecified external cause status: Secondary | ICD-10-CM | POA: Insufficient documentation

## 2016-01-12 DIAGNOSIS — Y9389 Activity, other specified: Secondary | ICD-10-CM | POA: Diagnosis not present

## 2016-01-12 MED ORDER — TETANUS-DIPHTH-ACELL PERTUSSIS 5-2.5-18.5 LF-MCG/0.5 IM SUSP
0.5000 mL | Freq: Once | INTRAMUSCULAR | Status: AC
  Administered 2016-01-12: 0.5 mL via INTRAMUSCULAR
  Filled 2016-01-12: qty 0.5

## 2016-01-12 MED ORDER — BACITRACIN ZINC 500 UNIT/GM EX OINT
1.0000 "application " | TOPICAL_OINTMENT | Freq: Once | CUTANEOUS | Status: AC
  Administered 2016-01-12: 1 via TOPICAL
  Filled 2016-01-12: qty 0.9

## 2016-01-12 NOTE — ED Triage Notes (Signed)
Pt states that he hit his leg getting into the truck yesterday, pt has abrasion and swelling to R shin. Ambulatory.

## 2016-01-12 NOTE — ED Provider Notes (Signed)
[facility] DEPT Provider Note   CSN: [id number] Arrival date & time: 01/12/16  [id number]     History   Chief Complaint Chief Complaint  Patient presents with  . Leg Injury    HPI  Blood pressure 143/87, pulse 82, temperature 98.2 F (36.8 C), temperature source Oral, resp. rate 16, SpO2 98 %.  Queen Weyman is a 45 y.o. male complaining of abrasion to right shin onset yesterday at 2 PM when he was getting into a truck and scraped his leg, last tetanus shot is unknown. He's been caring for it by applying hydrogen peroxide he also notes a fullness in his ear and asked for a blood pressure check. He states he recently moved here from [place] and does not have a primary care physician. He denies fever, chills, ear pain, rhinorrhea, chest pain, shortness of breath, abdominal pain, nausea vomiting  History reviewed. No pertinent past medical history.  There are no active problems to display for this patient.   Past Surgical History:  Procedure Laterality Date  . NASAL SEPTUM SURGERY         Home Medications    Prior to Admission medications   Not on File    Family History Family History  Problem Relation Age of Onset  . Cancer Mother     unsure of type  . Hypertension Mother   . Pneumonia Father 58    died of pneumonia  . Cancer Father     prostate in 72s  . Hypertension Father   . Other Sister     died in [place]  . Heart disease Brother     congenital   . Hypertension Brother   . Asthma Daughter   . Asthma Son   . Diabetes Neg Hx   . Stroke Neg Hx     Social History Social History  Substance Use Topics  . Smoking status: Never Smoker  . Smokeless tobacco: Never Used  . Alcohol use No     Allergies   Patient has no known allergies.   Review of Systems Review of Systems  10 systems reviewed and found to be negative, except as noted in the HPI.   Physical Exam Updated Vital Signs BP 143/87 (BP Location: Right Arm)   Pulse 82   Temp  98.2 F (36.8 C) (Oral)   Resp 16   SpO2 98%   Physical Exam  Constitutional: He is oriented to person, place, and time. He appears well-developed and well-nourished. No distress.  HENT:  Head: Normocephalic and atraumatic.  Right Ear: External ear normal.  Left Ear: External ear normal.  Mouth/Throat: Oropharynx is clear and moist. No oropharyngeal exudate.  No drooling or stridor. Posterior pharynx mildly erythematous no significant tonsillar hypertrophy. No exudate. Soft palate rises symmetrically. No TTP or induration under tongue.   No tenderness to palpation of frontal or bilateral maxillary sinuses.  Mild mucosal edema in the nares with scant rhinorrhea.  Bilateral tympanic membranes with normal architecture and good light reflex.    Eyes: Conjunctivae and EOM are normal. Pupils are equal, round, and reactive to light.  Neck: Normal range of motion. Neck supple.  Cardiovascular: Normal rate, regular rhythm and intact distal pulses.   Pulmonary/Chest: Effort normal and breath sounds normal. No stridor. No respiratory distress. He has no wheezes. He has no rales. He exhibits no tenderness.  Abdominal: Soft. There is no tenderness. There is no rebound and no guarding.  Musculoskeletal: Normal range of motion.  Neurological: He is alert  and oriented to person, place, and time.  Skin: He is not diaphoretic.  Partial thickness abrasion to right shin  Psychiatric: He has a normal mood and affect.  Nursing note and vitals reviewed.    ED Treatments / Results  Labs (all labs ordered are listed, but only abnormal results are displayed) Labs Reviewed - No data to display  EKG  EKG Interpretation None       Radiology No results found.  Procedures Procedures (including critical care time)  Medications Ordered in ED Medications  Tdap (BOOSTRIX) injection 0.5 mL (not administered)  bacitracin ointment 1 application (not administered)     Initial Impression /  Assessment and Plan / ED Course  I have reviewed the triage vital signs and the nursing notes.  Pertinent labs & imaging results that were available during my care of the patient were reviewed by me and considered in my medical decision making (see chart for details).  Clinical Course     Vitals:   01/12/16 0634  BP: 143/87  Pulse: 82  Resp: 16  Temp: 98.2 F (36.8 C)  TempSrc: Oral  SpO2: 98%    Medications  Tdap (BOOSTRIX) injection 0.5 mL (not administered)  bacitracin ointment 1 application (not administered)    Anrew Buchko is 45 y.o. male presenting with Abrasion to chin, tetanus is updated. He states that he has a fullness in his ears, no abnormality on physical exam. Blood pressure initially elevated, patient will need a recheck, case management is consulted and resource guide given. No signs of a symptomatic hypertension.  Evaluation does not show pathology that would require ongoing emergent intervention or inpatient treatment. Pt is hemodynamically stable and mentating appropriately. Discussed findings and plan with patient/guardian, who agrees with care plan. All questions answered. Return precautions discussed and outpatient follow up given.   Final Clinical Impressions(s) / ED Diagnoses   Final diagnoses:  Abrasion    New Prescriptions New Prescriptions   No medications on file     Monico Blitz, PA-C 01/12/16 [place], DO 01/12/16 [id number]

## 2016-01-12 NOTE — Telephone Encounter (Signed)
Made a cpe appt for DEC 6th at 8.15

## 2016-01-12 NOTE — Telephone Encounter (Signed)
He was seen in the ED.  Please get him in for routine visit as last visit was over a year ago

## 2016-01-12 NOTE — Discharge Instructions (Signed)
Wash the affected area with soap and water and apply a thin layer of topical antibiotic ointment. Do this every 12 hours.  ° °Do not use rubbing alcohol or hydrogen peroxide.                       ° °Look for signs of infection: if you see redness, if the area becomes warm, if pain increases sharply, there is discharge (pus), if red streaks appear or you develop fever or vomiting, RETURN immediately to the Emergency Department  for a recheck.  ° °

## 2016-02-03 ENCOUNTER — Encounter: Payer: Federal, State, Local not specified - PPO | Admitting: Medical

## 2016-02-24 ENCOUNTER — Encounter: Payer: Federal, State, Local not specified - PPO | Admitting: Medical

## 2016-03-09 ENCOUNTER — Encounter: Payer: Federal, State, Local not specified - PPO | Admitting: Medical

## 2016-04-19 ENCOUNTER — Ambulatory Visit (INDEPENDENT_AMBULATORY_CARE_PROVIDER_SITE_OTHER): Payer: Federal, State, Local not specified - PPO | Admitting: Medical

## 2016-04-19 ENCOUNTER — Encounter: Payer: Self-pay | Admitting: Medical

## 2016-04-19 VITALS — BP 132/76 | HR 82 | Ht 68.5 in | Wt 230.4 lb

## 2016-04-19 DIAGNOSIS — Z0000 Encounter for general adult medical examination without abnormal findings: Secondary | ICD-10-CM | POA: Diagnosis not present

## 2016-04-19 DIAGNOSIS — Z125 Encounter for screening for malignant neoplasm of prostate: Secondary | ICD-10-CM

## 2016-04-19 DIAGNOSIS — Z1211 Encounter for screening for malignant neoplasm of colon: Secondary | ICD-10-CM | POA: Insufficient documentation

## 2016-04-19 DIAGNOSIS — E669 Obesity, unspecified: Secondary | ICD-10-CM | POA: Insufficient documentation

## 2016-04-19 DIAGNOSIS — Z6834 Body mass index (BMI) 34.0-34.9, adult: Secondary | ICD-10-CM

## 2016-04-19 DIAGNOSIS — E66811 Obesity, class 1: Secondary | ICD-10-CM | POA: Insufficient documentation

## 2016-04-19 HISTORY — DX: Encounter for screening for malignant neoplasm of colon: Z12.11

## 2016-04-19 HISTORY — DX: Encounter for general adult medical examination without abnormal findings: Z00.00

## 2016-04-19 HISTORY — DX: Obesity, unspecified: E66.9

## 2016-04-19 HISTORY — DX: Body mass index (BMI) 34.0-34.9, adult: Z68.34

## 2016-04-19 LAB — CBC
HCT: 42.6 % (ref 38.5–50.0)
Hemoglobin: 15.1 g/dL (ref 13.2–17.1)
MCH: 30.4 pg (ref 27.0–33.0)
MCHC: 35.4 g/dL (ref 32.0–36.0)
MCV: 85.7 fL (ref 80.0–100.0)
MPV: 10.8 fL (ref 7.5–12.5)
Platelets: 190 10*3/uL (ref 140–400)
RBC: 4.97 MIL/uL (ref 4.20–5.80)
RDW: 14 % (ref 11.0–15.0)
WBC: 4.2 10*3/uL (ref 4.0–10.5)

## 2016-04-19 LAB — POCT URINALYSIS DIPSTICK
Bilirubin, UA: NEGATIVE
Glucose, UA: NEGATIVE
Ketones, UA: NEGATIVE
Leukocytes, UA: NEGATIVE
Nitrite, UA: NEGATIVE
Protein, UA: NEGATIVE
RBC UA: NEGATIVE
UROBILINOGEN UA: NEGATIVE
pH, UA: 6

## 2016-04-19 LAB — COMPREHENSIVE METABOLIC PANEL
ALT: 38 U/L (ref 9–46)
AST: 28 U/L (ref 10–40)
Albumin: 4.7 g/dL (ref 3.6–5.1)
Alkaline Phosphatase: 43 U/L (ref 40–115)
BUN: 10 mg/dL (ref 7–25)
CALCIUM: 9.7 mg/dL (ref 8.6–10.3)
CO2: 26 mmol/L (ref 20–31)
CREATININE: 0.95 mg/dL (ref 0.60–1.35)
Chloride: 106 mmol/L (ref 98–110)
Glucose, Bld: 96 mg/dL (ref 65–99)
POTASSIUM: 4.1 mmol/L (ref 3.5–5.3)
SODIUM: 139 mmol/L (ref 135–146)
TOTAL PROTEIN: 7.2 g/dL (ref 6.1–8.1)
Total Bilirubin: 0.5 mg/dL (ref 0.2–1.2)

## 2016-04-19 LAB — LIPID PANEL
CHOL/HDL RATIO: 4.8 ratio (ref <5.0)
CHOLESTEROL: 181 mg/dL (ref <200)
HDL: 38 mg/dL — AB (ref >40)
LDL CALC: 114 mg/dL — AB (ref <100)
Triglycerides: 147 mg/dL (ref <150)
VLDL: 29 mg/dL (ref <30)

## 2016-04-19 LAB — TSH: TSH: 1.11 mIU/L (ref 0.40–4.50)

## 2016-04-19 LAB — PSA: PSA: 0.4 ng/mL (ref <=4.0)

## 2016-04-19 NOTE — Progress Notes (Signed)
Subjective:   HPI  Bryan Allen is a 46 y.o. male who presents for a complete physical.  Concerns: Ears feel unusual sometimes  Chest hurts some, attributes to lifting weights.   No heart palpations, no sharp pains. No SOB, no dyspnea.   Reviewed their medical, surgical, family, social, medication, and allergy history and updated chart as appropriate.  No past medical history on file.  Past Surgical History:  Procedure Laterality Date  . NASAL SEPTUM SURGERY      Social History   Social History  . Marital status: Married    Spouse name: N/A  . Number of children: N/A  . Years of education: N/A   Occupational History  . Not on file.   Social History Main Topics  . Smoking status: Never Smoker  . Smokeless tobacco: Never Used  . Alcohol use No  . Drug use: No  . Sexual activity: Not on file   Other Topics Concern  . Not on file   Social History Narrative   Married, 2 children aged 66yo and 17yo, Fish farm manager for [employer], Christian, exercise - weights, cardio, biking.  03/2016    Family History  Problem Relation Age of Onset  . Cancer Mother     unsure of type  . Hypertension Mother   . Pneumonia Father 49    died of pneumonia  . Cancer Father     prostate in 48s  . Hypertension Father   . Other Sister     died in [place]  . Heart disease Brother     congenital, heart transplant 2017  . Hypertension Brother   . Asthma Daughter   . Asthma Son   . Hypertension Brother   . Diabetes Neg Hx   . Stroke Neg Hx      Current Outpatient Prescriptions:  .  vitamin B-12 (CYANOCOBALAMIN) 500 MCG tablet, Take 500 mcg by mouth daily., Disp: , Rfl:   No Known Allergies   Review of Systems Constitutional: -fever, -chills, -sweats, -unexpected weight change, -decreased appetite, -fatigue Allergy: -sneezing, -itching, -congestion Dermatology: -changing moles, --rash, -lumps ENT: -runny nose, -ear pain, -sore throat, -hoarseness, -sinus pain,  -teeth pain, - ringing in ears, -hearing loss, -nosebleeds Cardiology: -chest pain, -palpitations, -swelling, -difficulty breathing when lying flat, -waking up short of breath Respiratory: -cough, -shortness of breath, -difficulty breathing with exercise or exertion, -wheezing, -coughing up blood Gastroenterology: -abdominal pain, -nausea, -vomiting, -diarrhea, -constipation, -blood in stool, -changes in bowel movement, -difficulty swallowing or eating Hematology: -bleeding, -bruising  Musculoskeletal: -joint aches, -muscle aches, -joint swelling, -back pain, -neck pain, -cramping, -changes in gait Ophthalmology: denies vision changes, eye redness, itching, discharge Urology: -burning with urination, -difficulty urinating, -blood in urine, -urinary frequency, -urgency, -incontinence Neurology: -headache, -weakness, -tingling, -numbness, -memory loss, -falls, -dizziness Psychology: -depressed mood, -agitation, -sleep problems     Objective:   Physical Exam  BP 132/76   Pulse 82   Ht 5' 8.5" (1.74 m)   Wt 230 lb 6.4 oz (104.5 kg)   SpO2 97%   BMI 34.52 kg/m   BP Readings from Last 3 Encounters:  04/19/16 132/76  01/12/16 143/87  03/26/15 130/80   Wt Readings from Last 3 Encounters:  04/19/16 230 lb 6.4 oz (104.5 kg)  09/15/14 214 lb (97.1 kg)  08/14/14 218 lb (98.9 kg)   General appearance: alert, no distress, WD/WN, AA male Skin:left upper back rose tattoo, left upper back close to midline with brown unchanged 59mm diameter slightly raised lesion  unchanged for years HEENT: normocephalic, conjunctiva/corneas normal, sclerae anicteric, PERRLA, EOMi, nares patent, no discharge or erythema, pharynx normal Oral cavity: MMM, tongue normal, teeth in good repair Neck: supple, no lymphadenopathy, no thyromegaly, no masses, normal ROM, no bruits Chest: non tender, normal shape and expansion Heart: RRR, normal S1, S2, no murmurs Lungs: CTA bilaterally, no wheezes, rhonchi, or  rales Abdomen: +bs, soft, non tender, non distended, no masses, no hepatomegaly, no splenomegaly, no bruits Back: non tender, normal ROM, no scoliosis Musculoskeletal: upper extremities non tender, no obvious deformity, normal ROM throughout, lower extremities non tender, no obvious deformity, normal ROM throughout Extremities: no edema, no cyanosis, no clubbing Pulses: 2+ symmetric, upper and lower extremities, normal cap refill Neurological: alert, oriented x 3, CN2-12 intact, strength normal upper extremities and lower extremities, sensation normal throughout, DTRs 2+ throughout, no cerebellar signs, gait normal Psychiatric: normal affect, behavior normal, pleasant  GU: normal male external genitalia, circumcised, non tender, no masses, no hernia, no lymphadenopathy Rectal: anus normal tone, prostate WNL, no nodules   Assessment and Plan :    Encounter Diagnoses  Name Primary?  . Encounter for health maintenance examination in adult Yes  . Screening for prostate cancer   . Class 1 obesity without serious comorbidity with body mass index (BMI) of 34.0 to 34.9 in adult, unspecified obesity type      Physical exam - discussed healthy lifestyle, diet, exercise, preventative care, vaccinations, and addressed their concerns.   Work on [employer] loss efforts thorugh healthy diet and exercise Routine labs today See your dentist yearly for routine dental care including hygiene visits twice yearly. See your eye doctor yearly for routine vision care. Declines flu shot. Follow-up pending labs  Bryan Allen was seen today for physical.  Diagnoses and all orders for this visit:  Encounter for health maintenance examination in adult -     Urinalysis Dipstick -     Comprehensive metabolic panel -     Lipid panel -     CBC -     TSH -     PSA -     Hemoglobin A1c  Screening for prostate cancer -     PSA  Class 1 obesity without serious comorbidity with body mass index (BMI) of 34.0 to 34.9 in  adult, unspecified obesity type -     TSH -     Hemoglobin A1c

## 2016-04-20 ENCOUNTER — Encounter: Payer: Federal, State, Local not specified - PPO | Admitting: Medical

## 2016-04-20 LAB — HEMOGLOBIN A1C
Hgb A1c MFr Bld: 5.2 % (ref <5.7)
Mean Plasma Glucose: 103 mg/dL

## 2016-04-29 ENCOUNTER — Telehealth: Payer: Self-pay | Admitting: Medical

## 2016-04-29 NOTE — Telephone Encounter (Signed)
Pt requesting a call back to get clarification on lab results

## 2016-04-29 NOTE — Telephone Encounter (Signed)
Called and explain to pt again  About labs in detail.

## 2016-06-15 ENCOUNTER — Telehealth: Payer: Self-pay | Admitting: Medical

## 2016-06-15 ENCOUNTER — Other Ambulatory Visit: Payer: Self-pay | Admitting: Medical

## 2016-06-15 MED ORDER — LORATADINE 10 MG PO TABS: 10.0000 mg | ORAL_TABLET | Freq: Every day | ORAL | 3 refills | Status: DC

## 2016-06-15 NOTE — Telephone Encounter (Signed)
Pt wants to know if Audelia Acton can send in a script for Claritin 10 mg #90 so he can get this through his insurance for his nasal allergies. He has stuffy nose, nasal drip. He took his son's Claritin and this helps.

## 2016-10-25 ENCOUNTER — Encounter: Payer: Federal, State, Local not specified - PPO | Admitting: Medical

## 2017-05-26 ENCOUNTER — Other Ambulatory Visit: Payer: Self-pay

## 2017-05-26 ENCOUNTER — Encounter (HOSPITAL_COMMUNITY): Payer: Self-pay | Admitting: Emergency Medicine

## 2017-05-26 ENCOUNTER — Ambulatory Visit (HOSPITAL_COMMUNITY)
Admission: EM | Admit: 2017-05-26 | Discharge: 2017-05-26 | Disposition: A | Discharge status: Home / Self Care | Payer: Federal, State, Local not specified - PPO | Attending: Family Medicine | Admitting: Family Medicine

## 2017-05-26 DIAGNOSIS — F458 Other somatoform disorders: Secondary | ICD-10-CM | POA: Diagnosis not present

## 2017-05-26 DIAGNOSIS — R0989 Other specified symptoms and signs involving the circulatory and respiratory systems: Secondary | ICD-10-CM

## 2017-05-26 DIAGNOSIS — R198 Other specified symptoms and signs involving the digestive system and abdomen: Secondary | ICD-10-CM

## 2017-05-26 MED ORDER — ESOMEPRAZOLE MAGNESIUM 20 MG PO CPDR: 20.0000 mg | DELAYED_RELEASE_CAPSULE | Freq: Every day | ORAL | 0 refills | Status: DC

## 2017-05-26 MED ORDER — IPRATROPIUM BROMIDE 0.06 % NA SOLN: 2.0000 | Freq: Four times a day (QID) | NASAL | 0 refills | Status: DC

## 2017-05-26 MED ORDER — FLUTICASONE PROPIONATE 50 MCG/ACT NA SUSP: 2.0000 | Freq: Every day | NASAL | 0 refills | Status: DC

## 2017-05-26 NOTE — Discharge Instructions (Signed)
Start flonase and atrovent nasal spray for possible post nasal drainage causing symptoms. Also start nexium to see if acid reflux could be causing the sensation. Keep hydrated, your urine should be clear to pale yellow in color. Follow up with PCP for further evaluation if symptoms does not resolve.  If experiencing worsening symptoms, swelling of the throat, trouble breathing, trouble swallowing, swelling of the tongue, leaning forward to breathe, drooling, go to the emergency department for further evaluation.

## 2017-05-26 NOTE — ED Triage Notes (Signed)
C/o sore throat onset one week, denies rhinitis or HA

## 2017-05-26 NOTE — ED Provider Notes (Signed)
[facility]    CSN: [id number] Arrival date & time: 05/26/17  1927     History   Chief Complaint Chief Complaint  Patient presents with  . Sore Throat    HPI Bryan Allen is a 47 y.o. male.   47 year old male comes in for 1 week history of  throat discomfort.  States it does not feel like pain, but feels more like there is something in his throat.  Denies other URI symptoms such as cough, congestion, rhinorrhea.  Denies fever, chills, night sweats.  Denies abdominal pain, nausea, vomiting.  Denies history of GERD.  Has been taking Claritin, thinks it may have helped a little bit. Feels that his throat is swollen, but denies trouble breathing, trouble swallowing, drooling, tripoding.     History reviewed. No pertinent past medical history.  Patient Active Problem List   Diagnosis Date Noted  . Encounter for health maintenance examination in adult 04/19/2016  . Screening for prostate cancer 04/19/2016  . Class 1 obesity without serious comorbidity with body mass index (BMI) of 34.0 to 34.9 in adult 04/19/2016    Past Surgical History:  Procedure Laterality Date  . NASAL SEPTUM SURGERY         Home Medications    Prior to Admission medications   Medication Sig Start Date End Date Taking? Authorizing Provider  aspirin 500 MG tablet Take 500 mg by mouth every 6 (six) hours as needed for pain.   Yes [provider]  esomeprazole (NEXIUM) 20 MG capsule Take 1 capsule (20 mg total) by mouth daily for 14 days. 05/26/17 06/09/17  Tasia Catchings, Tamella Tuccillo V, PA-C  fluticasone (FLONASE) 50 MCG/ACT nasal spray Place 2 sprays into both nostrils daily. 05/26/17   Tasia Catchings, Khalia Gong V, PA-C  ipratropium (ATROVENT) 0.06 % nasal spray Place 2 sprays into both nostrils 4 (four) times daily. 05/26/17   Tasia Catchings, Marigrace Mccole V, PA-C  loratadine (CLARITIN) 10 MG tablet Take 1 tablet (10 mg total) by mouth daily. 06/15/16   Tysinger, Camelia Eng, PA-C  vitamin B-12 (CYANOCOBALAMIN) 500 MCG tablet Take 500 mcg by  mouth daily.    [provider]    Family History Family History  Problem Relation Age of Onset  . Cancer Mother        unsure of type  . Hypertension Mother   . Pneumonia Father 56       died of pneumonia  . Cancer Father        prostate in 84s  . Hypertension Father   . Other Sister        died in [place]  . Heart disease Brother        congenital, heart transplant 2017  . Hypertension Brother   . Asthma Daughter   . Asthma Son   . Hypertension Brother   . Diabetes Neg Hx   . Stroke Neg Hx     Social History Social History   Tobacco Use  . Smoking status: Never Smoker  . Smokeless tobacco: Never Used  Substance Use Topics  . Alcohol use: No  . Drug use: No     Allergies   Patient has no known allergies.   Review of Systems Review of Systems  Reason unable to perform ROS: See HPI as above.     Physical Exam Triage Vital Signs ED Triage Vitals  Enc Vitals Group     BP 05/26/17 1946 138/88     Pulse Rate 05/26/17 1946 90     Resp --  Temp 05/26/17 1946 98.5 F (36.9 C)     Temp Source 05/26/17 1946 Oral     SpO2 05/26/17 1946 99 %     Weight --      Height --      Head Circumference --      Peak Flow --      Pain Score 05/26/17 1941 5     Pain Loc --      Pain Edu? --      Excl. in [place]? --    No data found.  Updated Vital Signs BP 138/88 (BP Location: Left Arm)   Pulse 90   Temp 98.5 F (36.9 C) (Oral)   SpO2 99%   Physical Exam  Constitutional: He is oriented to person, place, and time. He appears well-developed and well-nourished.  Non-toxic appearance. He does not appear ill. No distress.  HENT:  Head: Normocephalic and atraumatic.  Right Ear: External ear and ear canal normal. Tympanic membrane is not erythematous and not bulging.  Left Ear: External ear and ear canal normal. Tympanic membrane is not erythematous and not bulging.  Nose: Rhinorrhea present. Right sinus exhibits no maxillary sinus tenderness and no frontal  sinus tenderness. Left sinus exhibits no maxillary sinus tenderness and no frontal sinus tenderness.  Mouth/Throat: Uvula is midline, oropharynx is clear and moist and mucous membranes are normal. No trismus in the jaw. No uvula swelling. No posterior oropharyngeal edema or posterior oropharyngeal erythema. Tonsils are 1+ on the right. Tonsils are 1+ on the left. No tonsillar exudate.  Bilateral TM opaque.  Eyes: Pupils are equal, round, and reactive to light. Conjunctivae are normal.  Neck: Normal range of motion. Neck supple.  Cardiovascular: Normal rate, regular rhythm and normal heart sounds. Exam reveals no gallop and no friction rub.  No murmur heard. Pulmonary/Chest: Effort normal and breath sounds normal. No accessory muscle usage or stridor. No respiratory distress. He has no decreased breath sounds. He has no wheezes. He has no rhonchi. He has no rales.  Patient speaking in full sentences without problems.  Lymphadenopathy:    He has no cervical adenopathy.  Neurological: He is alert and oriented to person, place, and time.  Skin: Skin is warm and dry.  Psychiatric: He has a normal mood and affect. His behavior is normal. Judgment normal.     UC Treatments / Results  Labs (all labs ordered are listed, but only abnormal results are displayed) Labs Reviewed - No data to display  EKG None Radiology No results found.  Procedures Procedures (including critical care time)  Medications Ordered in UC Medications - No data to display   Initial Impression / Assessment and Plan / UC Course  I have reviewed the triage vital signs and the nursing notes.  Pertinent labs & imaging results that were available during my care of the patient were reviewed by me and considered in my medical decision making (see chart for details).     Patient description of symptoms more consistent with globus sensation.  Will cover for postnasal drip with Atrovent and Flonase.  Continue to take  Claritin.  Start Nexium for possible GERD causing symptoms.  Push fluids.  Patient to follow-up with PCP for reevaluation if symptoms not improving.  Return precautions given.  Patient expresses understanding and agrees to plan.  Final Clinical Impressions(s) / UC Diagnoses   Final diagnoses:  Globus sensation    ED Discharge Orders        Ordered    esomeprazole (  NEXIUM) 20 MG capsule  Daily     05/26/17 2005    ipratropium (ATROVENT) 0.06 % nasal spray  4 times daily     05/26/17 2005    fluticasone (FLONASE) 50 MCG/ACT nasal spray  Daily     05/26/17 2005       [address], PA-C 05/26/17 2015

## 2017-06-09 ENCOUNTER — Emergency Department (HOSPITAL_COMMUNITY): Payer: Federal, State, Local not specified - PPO

## 2017-06-09 ENCOUNTER — Other Ambulatory Visit: Payer: Self-pay

## 2017-06-09 ENCOUNTER — Emergency Department (HOSPITAL_COMMUNITY)
Admission: EM | Admit: 2017-06-09 | Discharge: 2017-06-09 | Disposition: A | Discharge status: Home / Self Care | Payer: Federal, State, Local not specified - PPO | Attending: Emergency Medicine | Admitting: Emergency Medicine

## 2017-06-09 ENCOUNTER — Encounter (HOSPITAL_COMMUNITY): Payer: Self-pay | Admitting: Emergency Medicine

## 2017-06-09 DIAGNOSIS — R05 Cough: Secondary | ICD-10-CM | POA: Diagnosis not present

## 2017-06-09 DIAGNOSIS — J069 Acute upper respiratory infection, unspecified: Secondary | ICD-10-CM | POA: Insufficient documentation

## 2017-06-09 DIAGNOSIS — Z79899 Other long term (current) drug therapy: Secondary | ICD-10-CM | POA: Insufficient documentation

## 2017-06-09 MED ORDER — OSELTAMIVIR PHOSPHATE 75 MG PO CAPS
75.0000 mg | ORAL_CAPSULE | Freq: Two times a day (BID) | ORAL | 0 refills | Status: DC
Start: 2017-06-09 — End: 2019-11-20

## 2017-06-09 MED ORDER — ACETAMINOPHEN 325 MG PO TABS
650.0000 mg | ORAL_TABLET | Freq: Once | ORAL | Status: AC
  Administered 2017-06-09: 650 mg via ORAL
  Filled 2017-06-09: qty 2

## 2017-06-09 MED ORDER — AZITHROMYCIN 250 MG PO TABS: 250.0000 mg | ORAL_TABLET | Freq: Every day | ORAL | 0 refills | Status: DC

## 2017-06-09 NOTE — ED Provider Notes (Signed)
[facility] EMERGENCY DEPARTMENT Provider Note   CSN: [id number] Arrival date & time: 06/09/17  1423     History   Chief Complaint Chief Complaint  Patient presents with  . Cough    HPI Bryan Allen is a 47 y.o. male.  HPI   Patient is a 47 year old male who is presenting to the ED today complaining of productive cough with yellow/green sputum, sore throat, rhinorrhea, congestion, chills that began yesterday.  Denies any documented fevers at home.  Denies any chest pain or shortness of breath.  No abdominal pain, nausea, vomiting, diarrhea, urinary symptoms.  Also has bilateral ear fullness.  History reviewed. No pertinent past medical history.  Patient Active Problem List   Diagnosis Date Noted  . Encounter for health maintenance examination in adult 04/19/2016  . Screening for prostate cancer 04/19/2016  . Class 1 obesity without serious comorbidity with body mass index (BMI) of 34.0 to 34.9 in adult 04/19/2016    Past Surgical History:  Procedure Laterality Date  . NASAL SEPTUM SURGERY          Home Medications    Prior to Admission medications   Medication Sig Start Date End Date Taking? Authorizing Provider  aspirin 500 MG tablet Take 500 mg by mouth Allen 6 (six) hours as needed for pain.    [provider]  azithromycin (ZITHROMAX Z-PAK) 250 MG tablet Take 1 tablet (250 mg total) by mouth daily. Take 2 tablets on the first day of treatment. Then take 1 tablet per day for the next four days. 06/09/17   Emmamarie Kluender S, PA-C  esomeprazole (NEXIUM) 20 MG capsule Take 1 capsule (20 mg total) by mouth daily for 14 days. 05/26/17 06/09/17  Tasia Catchings, Amy V, PA-C  fluticasone (FLONASE) 50 MCG/ACT nasal spray Place 2 sprays into both nostrils daily. 05/26/17   Tasia Catchings, Amy V, PA-C  ipratropium (ATROVENT) 0.06 % nasal spray Place 2 sprays into both nostrils 4 (four) times daily. 05/26/17   Tasia Catchings, Amy V, PA-C  loratadine (CLARITIN) 10 MG tablet Take 1  tablet (10 mg total) by mouth daily. 06/15/16   Tysinger, Camelia Eng, PA-C  vitamin B-12 (CYANOCOBALAMIN) 500 MCG tablet Take 500 mcg by mouth daily.    [provider]    Family History Family History  Problem Relation Age of Onset  . Cancer Mother        unsure of type  . Hypertension Mother   . Pneumonia Father 20       died of pneumonia  . Cancer Father        prostate in 16s  . Hypertension Father   . Other Sister        died in [place]  . Heart disease Brother        congenital, heart transplant 2017  . Hypertension Brother   . Asthma Daughter   . Asthma Son   . Hypertension Brother   . Diabetes Neg Hx   . Stroke Neg Hx     Social History Social History   Tobacco Use  . Smoking status: Never Smoker  . Smokeless tobacco: Never Used  Substance Use Topics  . Alcohol use: No  . Drug use: No     Allergies   Patient has no known allergies.   Review of Systems Review of Systems  Constitutional: Positive for chills.  HENT: Positive for congestion, rhinorrhea and sore throat. Negative for ear pain, sinus pressure and sinus pain.   Eyes: Negative for  visual disturbance.  Respiratory: Positive for cough. Negative for shortness of breath.   Cardiovascular: Negative for chest pain and leg swelling.  Gastrointestinal: Negative for abdominal pain, constipation, diarrhea, nausea and vomiting.  Genitourinary: Negative for flank pain.  Musculoskeletal: Negative for back pain.  Skin: Negative for rash.  Neurological: Negative for dizziness, weakness, light-headedness and headaches.     Physical Exam Updated Vital Signs BP (!) 142/85   Pulse (!) 104   Temp 99.6 F (37.6 C)   Resp 16   SpO2 96%   Physical Exam  Constitutional: He appears well-developed and well-nourished.  HENT:  Head: Normocephalic and atraumatic.  Right Ear: External ear normal.  Left Ear: External ear normal.  No pharyngeal erythema or tonsillar swelling.  No tonsillar exudates.  Uvula  midline.  No evidence of PTA or retropharyngeal abscess.  Bilateral TMs without erythema or effusion.  Nose normal.  Eyes: Pupils are equal, round, and reactive to light. Conjunctivae and EOM are normal.  Neck: Normal range of motion. Neck supple.  Cardiovascular: Normal rate, regular rhythm, normal heart sounds and intact distal pulses.  No murmur heard. Pulmonary/Chest: Effort normal and breath sounds normal. No respiratory distress. He has no wheezes.  Abdominal: Soft. Bowel sounds are normal. He exhibits no distension. There is no tenderness.  Musculoskeletal: He exhibits no edema.  Neurological: He is alert.  Skin: Skin is warm and dry.  Psychiatric: He has a normal mood and affect.  Nursing note and vitals reviewed.    ED Treatments / Results  Labs (all labs ordered are listed, but only abnormal results are displayed) Labs Reviewed - No data to display  EKG None  Radiology Dg Chest 2 View  Result Date: 06/09/2017 CLINICAL DATA:  Cough EXAM: CHEST - 2 VIEW COMPARISON:  None. FINDINGS: The heart size and mediastinal contours are within normal limits. Both lungs are clear. The visualized skeletal structures are unremarkable. IMPRESSION: No active cardiopulmonary disease. Electronically Signed   By: Donavan Foil M.D.   On: 06/09/2017 18:21    Procedures Procedures (including critical care time)  Medications Ordered in ED Medications  acetaminophen (TYLENOL) tablet 650 mg (650 mg Oral Given 06/09/17 1905)     Initial Impression / Assessment and Plan / ED Course  I have reviewed the triage vital signs and the nursing notes.  Pertinent labs & imaging results that were available during my care of the patient were reviewed by me and considered in my medical decision making (see chart for details).     Final Clinical Impressions(s) / ED Diagnoses   Final diagnoses:  Upper respiratory tract infection, unspecified type   Patient presenting with cough and congestion for 1  day.  Found to be febrile on my exam to 100.4 Fahrenheit.  He was also slightly tachycardic initially which is likely due to his fever. Fever tx with tylenol in the ED and tachycardia slightly improved. Doubt PE.  Patient slightly hypertensive but denying chest pain shortness of breath or any other evidence of endorgan damage at this time.  Doubt hypertensive emergency/urgency, urged him to follow-up with PCP about this.  Chest x-ray was negative for pneumonia however given patient's symptoms he likely has flulike illness versus an early pneumonia. Will cover him for possible early pneumonia with Z-Pak and give tamiflu to tx possible flu. Discussed possible adverse side effects and pt is aware.  Advised him to stay hydrated over the next several days and return if worse.  Advised to treat fevers with  Tylenol and ibuprofen.  Advised to follow-up with his PCP.  ED Discharge Orders        Ordered    azithromycin (ZITHROMAX Z-PAK) 250 MG tablet  Daily     06/09/17 1930       Rodney Booze, PA-C 06/09/17 1941    Duffy Bruce, MD 06/10/17 1108

## 2017-06-09 NOTE — Discharge Instructions (Addendum)
You were given a prescription for antibiotics. Please take the antibiotic prescription fully.   I have prescribed a new medication for you today. It is important that when you pick the prescription up you discuss the potential interactions of this medication with other medications you are taking, including over the counter medications, with the pharmacists.   This new medication has potential side effects. Be sure to contact your primary care provider or return to the emergency department if you are experiencing new symptoms that you are unable to tolerate after starting the medication. You need to receive medical evaluation immediately if you start to experience blistering of the skin, rash, swelling, or difficulty breathing as these signs could indicate a more serious medication side effect.   Please follow-up with your primary care doctor for recheck in 1 week.  Please return to the emergency department for any new or worsening symptoms including any chest pain, shortness of breath, or any worsening of your symptoms.

## 2017-06-09 NOTE — ED Triage Notes (Signed)
Patient to ED c/o cough and congestion onset today. Reports he was seen for similar 2 weeks ago at [facility] and feels it could be allergies. Pt states he has tried OTC medications for cold symptoms which have relieved nasal drainage. Resp e/u, skin warm/dry. Lung sounds clear bilaterally.

## 2017-07-11 ENCOUNTER — Encounter: Payer: Self-pay | Admitting: Family Medicine

## 2017-07-11 ENCOUNTER — Ambulatory Visit (INDEPENDENT_AMBULATORY_CARE_PROVIDER_SITE_OTHER): Payer: Federal, State, Local not specified - PPO | Admitting: Family Medicine

## 2017-07-11 VITALS — BP 130/82 | HR 86 | Temp 98.1°F | Resp 20 | Ht 67.75 in | Wt 238.6 lb

## 2017-07-11 DIAGNOSIS — I493 Ventricular premature depolarization: Secondary | ICD-10-CM

## 2017-07-11 HISTORY — DX: Ventricular premature depolarization: I49.3

## 2017-07-11 NOTE — Progress Notes (Signed)
   Subjective:    Patient ID: Bryan Allen, male    DOB: 27-Nov-1970, 47 y.o.   MRN: [id number]  HPI He complains of a one-week history of a fluttering sensation.  No associated chest pain, shortness of breath, PND or DOE.  He does not drink coffee and is not on a decongestant.  He is not using any street drugs.  Alert and in no distress.  Cardiac exam  Review of Systems     Objective:   Physical Exam Alert and in no distress.  Cardiac exam did show an occasional skipped beat. EKG did show unifocal PVC however it was not caught on the strip.    Assessment & Plan:  Unifocal PVCs I explained that this was a unifocal PVC which since there is nothing else to associated with is really relatively benign.  If he notes more these I explained that we can do further work-up and possibly place him on medication.  He was comfortable with that.

## 2017-07-12 NOTE — Addendum Note (Signed)
Addended by: Elyse Jarvis on: 07/12/2017 09:54 AM   Modules accepted: Orders

## 2017-08-23 ENCOUNTER — Encounter: Payer: Federal, State, Local not specified - PPO | Admitting: Family Medicine

## 2017-10-11 ENCOUNTER — Encounter: Payer: Federal, State, Local not specified - PPO | Admitting: Family Medicine

## 2017-12-05 ENCOUNTER — Encounter: Payer: Federal, State, Local not specified - PPO | Admitting: Family Medicine

## 2017-12-28 ENCOUNTER — Other Ambulatory Visit: Payer: Self-pay

## 2017-12-28 ENCOUNTER — Encounter (HOSPITAL_COMMUNITY): Payer: Self-pay

## 2017-12-28 ENCOUNTER — Emergency Department (HOSPITAL_COMMUNITY)
Admission: EM | Admit: 2017-12-28 | Discharge: 2017-12-28 | Disposition: A | Discharge status: Home / Self Care | Payer: Federal, State, Local not specified - PPO | Attending: Emergency Medicine | Admitting: Emergency Medicine

## 2017-12-28 DIAGNOSIS — R112 Nausea with vomiting, unspecified: Secondary | ICD-10-CM | POA: Diagnosis not present

## 2017-12-28 DIAGNOSIS — A084 Viral intestinal infection, unspecified: Secondary | ICD-10-CM | POA: Diagnosis not present

## 2017-12-28 LAB — COMPREHENSIVE METABOLIC PANEL
ALT: 49 U/L — ABNORMAL HIGH (ref 0–44)
AST: 34 U/L (ref 15–41)
Albumin: 4.7 g/dL (ref 3.5–5.0)
Alkaline Phosphatase: 40 U/L (ref 38–126)
Anion gap: 7 (ref 5–15)
BUN: 10 mg/dL (ref 6–20)
CO2: 26 mmol/L (ref 22–32)
CREATININE: 1.08 mg/dL (ref 0.61–1.24)
Calcium: 9.6 mg/dL (ref 8.9–10.3)
Chloride: 106 mmol/L (ref 98–111)
GFR calc Af Amer: >60 mL/min (ref >60)
GFR calc non Af Amer: >60 mL/min (ref >60)
Glucose, Bld: 97 mg/dL (ref 70–99)
Potassium: 3.9 mmol/L (ref 3.5–5.1)
SODIUM: 139 mmol/L (ref 135–145)
Total Bilirubin: 0.9 mg/dL (ref 0.3–1.2)
Total Protein: 7.3 g/dL (ref 6.5–8.1)

## 2017-12-28 LAB — LIPASE, BLOOD: Lipase: 27 U/L (ref 11–51)

## 2017-12-28 LAB — CBC
HEMATOCRIT: 43.9 % (ref 39.0–52.0)
Hemoglobin: 14.2 g/dL (ref 13.0–17.0)
MCH: 28.5 pg (ref 26.0–34.0)
MCHC: 32.3 g/dL (ref 30.0–36.0)
MCV: 88.2 fL (ref 80.0–100.0)
NRBC: 0 % (ref 0.0–0.2)
Platelets: 210 10*3/uL (ref 150–400)
RBC: 4.98 MIL/uL (ref 4.22–5.81)
RDW: 13.1 % (ref 11.5–15.5)
WBC: 5.2 10*3/uL (ref 4.0–10.5)

## 2017-12-28 MED ORDER — ONDANSETRON HCL 4 MG PO TABS: 4.0000 mg | ORAL_TABLET | Freq: Four times a day (QID) | ORAL | 0 refills | Status: DC

## 2017-12-28 MED ORDER — ONDANSETRON 4 MG PO TBDP
4.0000 mg | ORAL_TABLET | Freq: Once | ORAL | Status: AC | PRN
  Administered 2017-12-28: 4 mg via ORAL
  Filled 2017-12-28: qty 1

## 2017-12-28 NOTE — ED Provider Notes (Signed)
[facility] EMERGENCY DEPARTMENT Provider Note   CSN: [id number] Arrival date & time: 12/28/17  1301     History   Chief Complaint Chief Complaint  Patient presents with  . Abdominal Pain    HPI Bryan Allen is a 47 y.o. male.  HPI   47 year-old male presents today with complaints of nausea vomiting and diarrhea.  Patient notes nonbloody nausea vomiting diarrhea x2 days, no significant abdominal pain.  He denies any fever, denies any close sick contacts or abnormal exposures.  Patient was given Zofran prior to my evaluation which has improved his symptoms.   History reviewed. No pertinent past medical history.  Patient Active Problem List   Diagnosis Date Noted  . 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 Encounter for health maintenance examination in adult 04/19/2016  . Screening for prostate cancer 04/19/2016  . Class 1 obesity without serious comorbidity with body mass index (BMI) of 34.0 to 34.9 in adult 04/19/2016    Past Surgical History:  Procedure Laterality Date  . NASAL SEPTUM SURGERY          Home Medications    Prior to Admission medications   Medication Sig Start Date End Date Taking? Authorizing Provider  aspirin 500 MG tablet Take 500 mg by mouth every 6 (six) hours as needed for pain.    [provider]  azithromycin (ZITHROMAX Z-PAK) 250 MG tablet Take 1 tablet (250 mg total) by mouth daily. Take 2 tablets on the first day of treatment. Then take 1 tablet per day for the next four days. Patient not taking: Reported on 07/11/2017 06/09/17   Couture, Cortni S, PA-C  esomeprazole (NEXIUM) 20 MG capsule Take 1 capsule (20 mg total) by mouth daily for 14 days. 05/26/17 06/09/17  Tasia Catchings, Amy V, PA-C  fluticasone (FLONASE) 50 MCG/ACT nasal spray Place 2 sprays into both nostrils daily. Patient not taking: Reported on 07/11/2017 05/26/17   Ok Edwards, PA-C  ipratropium (ATROVENT) 0.06 % nasal spray Place 2 sprays into both nostrils 4 (four) times  daily. Patient not taking: Reported on 07/11/2017 05/26/17   Ok Edwards, PA-C  loratadine (CLARITIN) 10 MG tablet Take 1 tablet (10 mg total) by mouth daily. Patient not taking: Reported on 07/11/2017 06/15/16   Tysinger, Camelia Eng, PA-C  ondansetron (ZOFRAN) 4 MG tablet Take 1 tablet (4 mg total) by mouth every 6 (six) hours. 12/28/17   Emmi Wertheim, Dellis Filbert, PA-C  oseltamivir (TAMIFLU) 75 MG capsule Take 1 capsule (75 mg total) by mouth every 12 (twelve) hours. Patient not taking: Reported on 07/11/2017 06/09/17   Couture, Cortni S, PA-C  vitamin B-12 (CYANOCOBALAMIN) 500 MCG tablet Take 500 mcg by mouth daily.    [provider]    Family History Family History  Problem Relation Age of Onset  . Cancer Mother        unsure of type  . Hypertension Mother   . Pneumonia Father 28       died of pneumonia  . Cancer Father        prostate in 52s  . Hypertension Father   . Other Sister        died in [place]  . Heart disease Brother        congenital, heart transplant 2017  . Hypertension Brother   . Asthma Daughter   . Asthma Son   . Hypertension Brother   . Diabetes Neg Hx   . Stroke Neg Hx     Social History  Social History   Tobacco Use  . Smoking status: Never Smoker  . Smokeless tobacco: Never Used  Substance Use Topics  . Alcohol use: No  . Drug use: No     Allergies   Patient has no known allergies.   Review of Systems Review of Systems  All other systems reviewed and are negative.    Physical Exam Updated Vital Signs BP 131/81 (BP Location: Right Arm)   Pulse 81   Temp 98.2 F (36.8 C) (Oral)   Resp 16   Ht 5\' 8"  (1.727 m)   Wt 104.3 kg   SpO2 94%   BMI 34.97 kg/m   Physical Exam  Constitutional: He is oriented to person, place, and time. He appears well-developed and well-nourished.  HENT:  Head: Normocephalic and atraumatic.  Eyes: Pupils are equal, round, and reactive to light. Conjunctivae are normal. Right eye exhibits no discharge. Left eye  exhibits no discharge. No scleral icterus.  Neck: Normal range of motion. No JVD present. No tracheal deviation present.  Pulmonary/Chest: Effort normal. No stridor.  Abdominal: Soft. He exhibits no distension and no mass. There is no tenderness. There is no rebound and no guarding. No hernia.  Neurological: He is alert and oriented to person, place, and time. Coordination normal.  Psychiatric: He has a normal mood and affect. His behavior is normal. Judgment and thought content normal.  Nursing note and vitals reviewed.    ED Treatments / Results  Labs (all labs ordered are listed, but only abnormal results are displayed) Labs Reviewed  COMPREHENSIVE METABOLIC PANEL - Abnormal; Notable for the following components:      Result Value   ALT 49 (*)    All other components within normal limits  LIPASE, BLOOD  CBC  URINALYSIS, ROUTINE W REFLEX MICROSCOPIC    EKG None  Radiology No results found.  Procedures Procedures (including critical care time)  Medications Ordered in ED Medications  ondansetron (ZOFRAN-ODT) disintegrating tablet 4 mg (4 mg Oral Given 12/28/17 1313)     Initial Impression / Assessment and Plan / ED Course  I have reviewed the triage vital signs and the nursing notes.  Pertinent labs & imaging results that were available during my care of the patient were reviewed by me and considered in my medical decision making (see chart for details).     47 year old male presents today with nausea vomiting diarrhea likely secondary to viral gastroenteritis.  He has reassuring work-up here.  Discharged with Zofran and return precautions.  Patient verbalized understanding and agreement to today's plan had no further questions or concerns at time of discharge.  Final Clinical Impressions(s) / ED Diagnoses   Final diagnoses:  Viral gastroenteritis    ED Discharge Orders         Ordered    ondansetron (ZOFRAN) 4 MG tablet  Every 6 hours     12/28/17 1457             Dyllon, Henken, PA-C 12/28/17 1739    Pattricia Boss, MD 12/29/17 1426

## 2017-12-28 NOTE — Discharge Instructions (Addendum)
Please read attached information. If you experience any new or worsening signs or symptoms please return to the emergency room for evaluation. Please follow-up with your primary care provider or specialist as discussed. Please use medication prescribed only as directed and discontinue taking if you have any concerning signs or symptoms.   °

## 2017-12-28 NOTE — ED Triage Notes (Signed)
Pt presents with 2 day h/o generalized abdominal pain, nausea, vomiting and diarrhea.  Unknown sick contact.

## 2018-03-13 ENCOUNTER — Encounter: Payer: Federal, State, Local not specified - PPO | Admitting: Family Medicine

## 2018-05-01 ENCOUNTER — Encounter: Payer: Self-pay | Admitting: Family Medicine

## 2018-05-01 ENCOUNTER — Telehealth: Payer: Self-pay | Admitting: Family Medicine

## 2018-05-01 ENCOUNTER — Ambulatory Visit (INDEPENDENT_AMBULATORY_CARE_PROVIDER_SITE_OTHER): Payer: Federal, State, Local not specified - PPO | Admitting: Family Medicine

## 2018-05-01 VITALS — BP 120/86 | HR 98 | Temp 98.3°F | Ht 68.0 in | Wt 236.2 lb

## 2018-05-01 DIAGNOSIS — G4733 Obstructive sleep apnea (adult) (pediatric): Secondary | ICD-10-CM

## 2018-05-01 DIAGNOSIS — Z6834 Body mass index (BMI) 34.0-34.9, adult: Secondary | ICD-10-CM | POA: Diagnosis not present

## 2018-05-01 DIAGNOSIS — Z0000 Encounter for general adult medical examination without abnormal findings: Secondary | ICD-10-CM

## 2018-05-01 DIAGNOSIS — E669 Obesity, unspecified: Secondary | ICD-10-CM | POA: Diagnosis not present

## 2018-05-01 DIAGNOSIS — Z23 Encounter for immunization: Secondary | ICD-10-CM | POA: Diagnosis not present

## 2018-05-01 DIAGNOSIS — J309 Allergic rhinitis, unspecified: Secondary | ICD-10-CM | POA: Insufficient documentation

## 2018-05-01 DIAGNOSIS — I493 Ventricular premature depolarization: Secondary | ICD-10-CM

## 2018-05-01 LAB — POCT URINALYSIS DIP (PROADVANTAGE DEVICE)
BILIRUBIN UA: NEGATIVE
Blood, UA: NEGATIVE
Glucose, UA: NEGATIVE mg/dL
Ketones, POC UA: NEGATIVE mg/dL
LEUKOCYTES UA: NEGATIVE
NITRITE UA: NEGATIVE
PH UA: 6 (ref 5.0–8.0)
PROTEIN UA: NEGATIVE mg/dL
Specific Gravity, Urine: 1.025
UUROB: 3.5

## 2018-05-01 NOTE — Telephone Encounter (Signed)
Pt forgot to tell you that his dad had colon cancer around 68

## 2018-05-01 NOTE — Progress Notes (Signed)
Subjective:    Patient ID: Bryan Allen, male    DOB: 12/09/1970, 48 y.o.   MRN: [id number]  HPI He is here for complete examination.  He has no particular concerns or complaints.  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  Apparently he is doing well on the CPAP but has not gotten the readout.  He does work 2 jobs which interferes with him taking good care of himself.  Does have underlying allergies which seem to be under good control.  He is married and home life are going well.  Family and social history as well as health maintenance and immunizations was reviewed.  His father did have colon cancer in his 61s.   Review of Systems  All other systems reviewed and are negative.      Objective:   Physical Exam BP 120/86 (BP Location: Left Arm, Patient Position: Sitting)   Pulse 98   Temp 98.3 F (36.8 C)   Ht 5\' 8"  (1.727 m)   Wt 236 lb 3.2 oz (107.1 kg)   SpO2 95%   BMI 35.91 kg/m   General Appearance:    Alert, cooperative, no distress, appears stated age  Head:    Normocephalic, without obvious abnormality, atraumatic  Eyes:    PERRL, conjunctiva/corneas clear, EOM's intact, fundi    benign  Ears:    Normal TM's and external ear canals  Nose:   Nares normal, mucosa normal, no drainage or sinus   tenderness  Throat:   Lips, mucosa, and tongue normal; teeth and gums normal  Neck:   Supple, no lymphadenopathy;  thyroid:  no   enlargement/tenderness/nodules; no carotid   bruit or JVD  Back:    Spine nontender, no curvature, ROM normal, no CVA     tenderness  Lungs:     Clear to auscultation bilaterally without wheezes, rales or     ronchi; respirations unlabored      Heart:    Regular rate and rhythm, S1 and S2 normal, no murmur, rub   or gallop     Abdomen:     Soft, non-tender, nondistended, normoactive bowel sounds,    no masses, no hepatosplenomegaly  Genitalia:   Deferred  Rectal:   Deferred  Extremities:   No clubbing,  cyanosis or edema  Pulses:   2+ and symmetric all extremities  Skin:   Skin color, texture, turgor normal, no rashes or lesions  Lymph nodes:   Cervical, supraclavicular, and axillary nodes normal  Neurologic:   CNII-XII intact, normal strength, sensation and gait; reflexes 2+ and symmetric throughout          Psych:   Normal mood, affect, hygiene and grooming.          Assessment & Plan:  Routine general medical examination at a health care facility - Plan: CBC with Differential/Platelet, Comprehensive metabolic panel, Lipid panel, POCT Urinalysis DIP (Proadvantage Device)  Need for influenza vaccination - Plan: Flu Vaccine QUAD 6+ mos PF IM (Fluarix Quad PF)  Class 1 obesity without serious comorbidity with body mass index (BMI) of 34.0 to 34.9 in adult, unspecified obesity type - Plan: CBC with Differential/Platelet, Comprehensive metabolic panel, Lipid panel  OSA (obstructive sleep apnea)  Unifocal PVCs  Allergic rhinitis, unspecified seasonality, unspecified trigger 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  Also he is to get a CPAP read out and send it to me so I can  document this in the record. I then discussed his weight and lifestyle.  Encouraged him to be as physically active as he can with his work schedule.  Then discussed diet with him in regard to carbohydrates.  Discussed the [place] with him.  Encouraged him to set a particular waist size as his goal.  Also discussed the fact that weight and OSA are interrelated.

## 2018-05-01 NOTE — Telephone Encounter (Signed)
Let him know that we will start testing him at age 48

## 2018-05-02 LAB — CBC WITH DIFFERENTIAL/PLATELET
Basophils Absolute: 0 10*3/uL (ref 0.0–0.2)
Basos: 0 %
EOS (ABSOLUTE): 0.1 10*3/uL (ref 0.0–0.4)
EOS: 2 %
Hematocrit: 44.3 % (ref 37.5–51.0)
Hemoglobin: 15.2 g/dL (ref 13.0–17.7)
Immature Grans (Abs): 0 10*3/uL (ref 0.0–0.1)
Immature Granulocytes: 0 %
LYMPHS: 49 %
Lymphocytes Absolute: 2.6 10*3/uL (ref 0.7–3.1)
MCH: 29 pg (ref 26.6–33.0)
MCHC: 34.3 g/dL (ref 31.5–35.7)
MCV: 84 fL (ref 79–97)
MONOCYTES: 8 %
MONOS ABS: 0.4 10*3/uL (ref 0.1–0.9)
NEUTROS PCT: 41 %
Neutrophils Absolute: 2.2 10*3/uL (ref 1.4–7.0)
Platelets: 201 10*3/uL (ref 150–450)
RBC: 5.25 x10E6/uL (ref 4.14–5.80)
RDW: 14 % (ref 11.6–15.4)
WBC: 5.3 10*3/uL (ref 3.4–10.8)

## 2018-05-02 LAB — COMPREHENSIVE METABOLIC PANEL
A/G RATIO: 2.7 — AB (ref 1.2–2.2)
ALT: 35 IU/L (ref 0–44)
AST: 25 IU/L (ref 0–40)
Albumin: 5.1 g/dL — ABNORMAL HIGH (ref 4.0–5.0)
Alkaline Phosphatase: 48 IU/L (ref 39–117)
BILIRUBIN TOTAL: 0.4 mg/dL (ref 0.0–1.2)
BUN/Creatinine Ratio: 12 (ref 9–20)
BUN: 13 mg/dL (ref 6–24)
CHLORIDE: 103 mmol/L (ref 96–106)
CO2: 22 mmol/L (ref 20–29)
Calcium: 9.7 mg/dL (ref 8.7–10.2)
Creatinine, Ser: 1.11 mg/dL (ref 0.76–1.27)
GFR calc non Af Amer: 78 mL/min/{1.73_m2} (ref >59)
GFR, EST AFRICAN AMERICAN: 90 mL/min/{1.73_m2} (ref >59)
Globulin, Total: 1.9 g/dL (ref 1.5–4.5)
Glucose: 87 mg/dL (ref 65–99)
POTASSIUM: 4.2 mmol/L (ref 3.5–5.2)
Sodium: 139 mmol/L (ref 134–144)
TOTAL PROTEIN: 7 g/dL (ref 6.0–8.5)

## 2018-05-02 LAB — LIPID PANEL
Chol/HDL Ratio: 3.9 ratio (ref 0.0–5.0)
Cholesterol, Total: 169 mg/dL (ref 100–199)
HDL: 43 mg/dL (ref >39)
LDL Calculated: 111 mg/dL — ABNORMAL HIGH (ref 0–99)
Triglycerides: 74 mg/dL (ref 0–149)
VLDL Cholesterol Cal: 15 mg/dL (ref 5–40)

## 2018-05-03 NOTE — Telephone Encounter (Signed)
Pt advised. KH 

## 2018-05-15 ENCOUNTER — Telehealth: Payer: Self-pay

## 2018-05-15 NOTE — Telephone Encounter (Signed)
Spoke to pt about labs . Bryan Allen

## 2018-06-06 ENCOUNTER — Other Ambulatory Visit: Payer: Self-pay | Admitting: Medical

## 2018-09-02 DIAGNOSIS — S99921A Unspecified injury of right foot, initial encounter: Secondary | ICD-10-CM | POA: Diagnosis not present

## 2018-09-24 ENCOUNTER — Ambulatory Visit: Payer: Federal, State, Local not specified - PPO | Admitting: Family Medicine

## 2018-09-24 ENCOUNTER — Other Ambulatory Visit: Payer: Self-pay

## 2018-09-24 ENCOUNTER — Ambulatory Visit (INDEPENDENT_AMBULATORY_CARE_PROVIDER_SITE_OTHER): Payer: Federal, State, Local not specified - PPO | Admitting: Family Medicine

## 2018-09-24 ENCOUNTER — Encounter: Payer: Self-pay | Admitting: Family Medicine

## 2018-09-24 VITALS — BP 120/86 | HR 94 | Temp 98.6°F | Wt 248.6 lb

## 2018-09-24 DIAGNOSIS — R030 Elevated blood-pressure reading, without diagnosis of hypertension: Secondary | ICD-10-CM

## 2018-09-24 NOTE — Progress Notes (Signed)
   Subjective:    Patient ID: Bryan Allen, male    DOB: 06/13/1970, 48 y.o.   MRN: [id number]  HPI He is here for check of his blood pressure.  Apparently he was recently seen in an urgent care center and did have an elevated blood pressure.  He does have a blood pressure cuff at home and he did bring this in with him.  Review of Systems     Objective:   Physical Exam Alert and in no distress.  Blood pressure today is normal.  Review of his record also indicates fairly good blood pressure readings.  His machine is accurate.       Assessment & Plan:   Encounter Diagnosis  Name Primary?  . Elevated blood pressure reading Yes  I explained that one blood pressure reading is not of any major concern.  Explained that we would be more concerned about sustained elevated blood pressure.  Encouraged him to start a regular exercise program as his weight has gone up which can affect his blood pressure.  He will monitor his blood pressure at home.  He was instructed on proper use and procedure for checking his blood pressure.

## 2018-10-01 ENCOUNTER — Telehealth: Payer: Self-pay | Admitting: Family Medicine

## 2018-10-01 NOTE — Telephone Encounter (Signed)
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  He needs last 3 ov with bp.  He will pick up.

## 2018-10-28 ENCOUNTER — Other Ambulatory Visit: Payer: Self-pay

## 2018-10-28 ENCOUNTER — Emergency Department (HOSPITAL_COMMUNITY)
Admission: EM | Admit: 2018-10-28 | Discharge: 2018-10-28 | Disposition: A | Discharge status: Home / Self Care | Payer: Federal, State, Local not specified - PPO | Attending: Emergency Medicine | Admitting: Emergency Medicine

## 2018-10-28 DIAGNOSIS — H5712 Ocular pain, left eye: Secondary | ICD-10-CM | POA: Insufficient documentation

## 2018-10-28 DIAGNOSIS — Z79899 Other long term (current) drug therapy: Secondary | ICD-10-CM | POA: Insufficient documentation

## 2018-10-28 MED ORDER — ERYTHROMYCIN 5 MG/GM OP OINT: TOPICAL_OINTMENT | OPHTHALMIC | 0 refills | Status: DC

## 2018-10-28 MED ORDER — FLUORESCEIN SODIUM 1 MG OP STRP
1.0000 | ORAL_STRIP | Freq: Once | OPHTHALMIC | Status: AC
  Administered 2018-10-28: 17:00:00 1 via OPHTHALMIC
  Filled 2018-10-28: qty 1

## 2018-10-28 MED ORDER — TETRACAINE HCL 0.5 % OP SOLN
2.0000 [drp] | Freq: Once | OPHTHALMIC | Status: AC
  Administered 2018-10-28: 2 [drp] via OPHTHALMIC
  Filled 2018-10-28: qty 4

## 2018-10-28 NOTE — Discharge Instructions (Signed)
Use antibiotic ointment as directed.  If your symptoms are not better in 24 to 48 hours, follow-up with referred ophthalmology doctor.  Return emergency department if you have any fevers, worsening pain, vision changes or any other worsening or concerning symptoms.

## 2018-10-28 NOTE — ED Provider Notes (Signed)
[facility] EMERGENCY DEPARTMENT Provider Note   CSN: [id number] Arrival date & time: 10/28/18  1602     History   Chief Complaint Chief Complaint  Patient presents with  . Eye Problem    HPI Bryan Allen is a 48 y.o. male who presents for evaluation of left eye pain that began 2 days ago.  He denies any preceding trauma, injury, fall.  He does state that he was playing with his dog and then he rubbed his eye.  He states that he started having some pain and discomfort and then noticed some redness around the eye yesterday.  He states that when he wakes up, he does have some mucus around the eye but no active drainage.  He has not noted any fevers, photophobia.  He denies any blurry vision.  He does not wear contacts.     The history is provided by the patient.    Past Medical History:  Diagnosis Date  . Allergy     Patient Active Problem List   Diagnosis Date Noted  . OSA (obstructive sleep apnea) 05/01/2018  . Allergic rhinitis 05/01/2018  . 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 Encounter for health maintenance examination in adult 04/19/2016  . Screening for prostate cancer 04/19/2016  . Class 1 obesity without serious comorbidity with body mass index (BMI) of 34.0 to 34.9 in adult 04/19/2016    Past Surgical History:  Procedure Laterality Date  . NASAL SEPTUM SURGERY          Home Medications    Prior to Admission medications   Medication Sig Start Date End Date Taking? Authorizing Provider  aspirin 500 MG tablet Take 500 mg by mouth every 6 (six) hours as needed for pain.    [provider]  azithromycin (ZITHROMAX Z-PAK) 250 MG tablet Take 1 tablet (250 mg total) by mouth daily. Take 2 tablets on the first day of treatment. Then take 1 tablet per day for the next four days. Patient not taking: Reported on 07/11/2017 06/09/17   Couture, Cortni S, PA-C  erythromycin ophthalmic ointment Place a 1/2 inch ribbon of ointment into the lower  eyelid. 10/28/18   Volanda Napoleon, PA-C  erythromycin ophthalmic ointment Place a 1/2 inch ribbon of ointment into the lower eyelid. 10/28/18   Volanda Napoleon, PA-C  esomeprazole (NEXIUM) 20 MG capsule Take 1 capsule (20 mg total) by mouth daily for 14 days. 05/26/17 06/09/17  Tasia Catchings, Amy V, PA-C  fluticasone (FLONASE) 50 MCG/ACT nasal spray SPRAY 2 SPRAYS INTO EACH NOSTRIL EVERY DAY Patient not taking: Reported on 09/24/2018 06/06/18   Denita Lung, MD  ipratropium (ATROVENT) 0.06 % nasal spray Place 2 sprays into both nostrils 4 (four) times daily. Patient not taking: Reported on 07/11/2017 05/26/17   Ok Edwards, PA-C  loratadine (CLARITIN) 10 MG tablet TAKE 1 TABLET BY MOUTH EVERY DAY Patient not taking: Reported on 09/24/2018 06/06/18   Denita Lung, MD  ondansetron (ZOFRAN) 4 MG tablet Take 1 tablet (4 mg total) by mouth every 6 (six) hours. Patient not taking: Reported on 05/01/2018 12/28/17   Hedges, Dellis Filbert, PA-C  oseltamivir (TAMIFLU) 75 MG capsule Take 1 capsule (75 mg total) by mouth every 12 (twelve) hours. Patient not taking: Reported on 07/11/2017 06/09/17   Couture, Cortni S, PA-C  vitamin B-12 (CYANOCOBALAMIN) 500 MCG tablet Take 500 mcg by mouth daily.    [provider]    Family History Family History  Problem Relation Age of  Onset  . Cancer Mother        unsure of type  . Hypertension Mother   . Pneumonia Father 32       died of pneumonia  . Cancer Father 79       colon  . Hypertension Father   . Other Sister        died in [place]  . Heart disease Brother        congenital, heart transplant 2017  . Hypertension Brother   . Asthma Daughter   . Asthma Son   . Hypertension Brother   . Diabetes Neg Hx   . Stroke Neg Hx     Social History Social History   Tobacco Use  . Smoking status: Never Smoker  . Smokeless tobacco: Never Used  Substance Use Topics  . Alcohol use: No  . Drug use: No     Allergies   Patient has no known allergies.   Review of  Systems Review of Systems  Constitutional: Negative for fever.  Eyes: Positive for pain, discharge and redness. Negative for visual disturbance.  All other systems reviewed and are negative.    Physical Exam Updated Vital Signs BP (!) 160/87 (BP Location: Right Arm)   Pulse 100   Resp 17   Ht 5\' 8"  (1.727 m)   Wt 108.9 kg   SpO2 98%   BMI 36.49 kg/m   Physical Exam Vitals signs and nursing note reviewed.  Constitutional:      Appearance: He is well-developed.  HENT:     Head: Normocephalic and atraumatic.  Eyes:     General: No scleral icterus.       Right eye: No discharge.        Left eye: No discharge.     Conjunctiva/sclera:     Left eye: Left conjunctiva is injected.     Comments: Conjunctival injection noted on the left side. PERRL.  EOMs intact without any difficulty.  He does feel like when he moves his left eye medially, he feels like a scratching sensation but denies any pain.  No periorbital edema, erythema.  No hyphema, hypopyon.  Pulmonary:     Effort: Pulmonary effort is normal.  Skin:    General: Skin is warm and dry.  Neurological:     Mental Status: He is alert.  Psychiatric:        Speech: Speech normal.        Behavior: Behavior normal.      ED Treatments / Results  Labs (all labs ordered are listed, but only abnormal results are displayed) Labs Reviewed - No data to display  EKG None  Radiology No results found.  Procedures Procedures (including critical care time)  Medications Ordered in ED Medications  fluorescein ophthalmic strip 1 strip (1 strip Both Eyes Given 10/28/18 1644)  tetracaine (PONTOCAINE) 0.5 % ophthalmic solution 2 drop (2 drops Both Eyes Given 10/28/18 1644)     Initial Impression / Assessment and Plan / ED Course  I have reviewed the triage vital signs and the nursing notes.  Pertinent labs & imaging results that were available during my care of the patient were reviewed by me and considered in my medical  decision making (see chart for details).        48 year old male who presents for evaluation of right eye pain, redness x2 days.  No preceding trauma, injury though states he was playing with his dog prior to onset of symptoms.  No fevers, photophobia, vision  changes. Patient is afebrile, non-toxic appearing, sitting comfortably on examination table. Vital signs reviewed and stable.  Consider conjunctivitis versus corneal abrasion.  History/physical exam not concerning for septal or preseptal cellulitis.  Plan for evaluation with Woods lamp, IOP's, visual acuity.  Woods lamp evaluation shows no evidence of fluorescein uptake, corneal abrasion, dendritic lesion.    Visual Acuity  Right Eye Distance: 20/32 Left Eye Distance: 20/32 Bilateral Distance: 20/25  Intraocular pressure as documented below:  Left IOP: 23, 15, 18 Right IOP: 16, 17, 15  Exam not concerning for corneal abrasion, glaucoma.  He does have significant conjunctival injection so question if this is conjunctivitis given history of discharge from the eye.  Will plan to treat with erythromycin ointment given his description of scratch.  I did not see any obvious evidence of corneal abrasion but will cover just in case.  Patient given outpatient ophthalmology to follow-up with. At this time, patient exhibits no emergent life-threatening condition that require further evaluation in ED or admission. Patient had ample opportunity for questions and discussion. All patient's questions were answered with full understanding. Strict return precautions discussed. Patient expresses understanding and agreement to plan.   Portions of this note were generated with [employer]. Dictation errors may occur despite best attempts at proofreading.    Final Clinical Impressions(s) / ED Diagnoses   Final diagnoses:  Left eye pain    ED Discharge Orders         Ordered    erythromycin ophthalmic ointment     10/28/18 1802     erythromycin ophthalmic ointment     10/28/18 1931           Desma Mcgregor 10/28/18 Towanda Octave, MD 10/29/18 1042

## 2018-10-28 NOTE — ED Triage Notes (Signed)
Pt reports pain to Lt eye for 2 days.

## 2018-10-28 NOTE — ED Notes (Signed)
[facility] called back and [facility] [address] [facility]  Notified [place], PA and [facility] [address]  Charge RN called pt and notified him.

## 2018-10-30 ENCOUNTER — Other Ambulatory Visit: Payer: Self-pay

## 2018-10-30 ENCOUNTER — Ambulatory Visit (INDEPENDENT_AMBULATORY_CARE_PROVIDER_SITE_OTHER): Payer: Federal, State, Local not specified - PPO | Admitting: Family Medicine

## 2018-10-30 ENCOUNTER — Telehealth: Payer: Self-pay | Admitting: Family Medicine

## 2018-10-30 ENCOUNTER — Encounter: Payer: Self-pay | Admitting: Family Medicine

## 2018-10-30 VITALS — BP 128/84 | HR 93 | Temp 98.7°F | Wt 245.4 lb

## 2018-10-30 DIAGNOSIS — H1032 Unspecified acute conjunctivitis, left eye: Secondary | ICD-10-CM

## 2018-10-30 NOTE — Telephone Encounter (Signed)
Work in appt made for today

## 2018-10-30 NOTE — Telephone Encounter (Signed)
Pt called and states he was seen in the ER on 10/28/2018. He was diagnosed with pink eye, given medication and a work note for 2 days. He is calling today stating that his eye is still painful and bothering him. He is requesting more days. Pt can be reached at [phone].

## 2018-10-30 NOTE — Telephone Encounter (Signed)
appt

## 2018-10-30 NOTE — Progress Notes (Signed)
   Subjective:    Patient ID: Bryan Allen, male    DOB: 12/22/1970, 48 y.o.   MRN: [id number]  HPI He was seen in an emergency room for evaluation of conjunctivitis.  He states that he did have some drainage from the eye and was given an ointment.  The discomfort has gotten slightly better.  He is here to potentially get another note for work.   Review of Systems     Objective:   Physical Exam Alert and in no distress.  Exam of the left eye does show some slight injection.  The cornea and anterior chamber appear normal.  No tenderness to the orbit.       Assessment & Plan:   Encounter Diagnosis  Name Primary?  . Acute conjunctivitis of left eye, unspecified acute conjunctivitis type Yes  I recommended that he continue to use the ointment and I could not give a note for being out of work for couple more days as that would not be appropriate.  He will call if he has further trouble.

## 2018-12-25 DIAGNOSIS — K080 Exfoliation of teeth due to systemic causes: Secondary | ICD-10-CM | POA: Diagnosis not present

## 2019-01-23 ENCOUNTER — Telehealth: Payer: Self-pay | Admitting: Family Medicine

## 2019-01-23 NOTE — Telephone Encounter (Signed)
Pt called and is requesting a new cpap states his is broken, pt can be reached at [phone]

## 2019-01-23 NOTE — Telephone Encounter (Signed)
Pt was made an appt. KH 

## 2019-01-29 ENCOUNTER — Ambulatory Visit (INDEPENDENT_AMBULATORY_CARE_PROVIDER_SITE_OTHER): Payer: Federal, State, Local not specified - PPO | Admitting: Family Medicine

## 2019-01-29 ENCOUNTER — Encounter: Payer: Self-pay | Admitting: Family Medicine

## 2019-01-29 ENCOUNTER — Other Ambulatory Visit: Payer: Self-pay

## 2019-01-29 VITALS — BP 118/80 | HR 89 | Temp 97.3°F | Wt 250.6 lb

## 2019-01-29 DIAGNOSIS — G4733 Obstructive sleep apnea (adult) (pediatric): Secondary | ICD-10-CM | POA: Diagnosis not present

## 2019-01-29 DIAGNOSIS — Z638 Other specified problems related to primary support group: Secondary | ICD-10-CM

## 2019-01-29 DIAGNOSIS — Z6838 Body mass index (BMI) 38.0-38.9, adult: Secondary | ICD-10-CM

## 2019-01-29 NOTE — Progress Notes (Signed)
   Subjective:    Patient ID: Bryan Allen, male    DOB: 1971-02-25, 48 y.o.   MRN: [id number]  HPI He is here for consult concerning OSA.  He was diagnosed roughly 10 years ago and did use CPAP until recently.  He apparently broke his machine and now would like a replacement.  Original diagnosis was made in [place]. Also he has concerned about his weight gain and recognizes this is because he is now working 2 jobs and feeling responsible to take care of her his entire family.  He has a 65 and a 49 year old that are living at home with him as well as his wife.   Review of Systems     Objective:   Physical Exam Alert and in no distress.  Weight is recorded.  Epworth score was 11       Assessment & Plan:  OSA (obstructive sleep apnea) - Plan: Home sleep test  Class 2 severe obesity due to excess calories with serious comorbidity and body mass index (BMI) of 38.0 to 38.9 in adult [facility])  Stress due to family tension I will set him up for home sleep study.  Discussed the steps taken to get this thing accomplished. I then discussed diet with him in terms of cutting back on carbohydrates.  Also discussed the fact that getting back on his CPAP should help with all this. I then discussed the family stress that he is under.  He feels responsible for everybody and is now working 2 jobs to help with that but is now affecting his health.  Strongly encouraged him to have a meeting with the children and the wife to discuss options other than him continuing to work 2 jobs.

## 2019-03-19 ENCOUNTER — Ambulatory Visit: Payer: Federal, State, Local not specified - PPO | Admitting: Medical

## 2019-03-19 ENCOUNTER — Ambulatory Visit
Admission: RE | Admit: 2019-03-19 | Discharge: 2019-03-19 | Disposition: A | Discharge status: Home / Self Care | Payer: Federal, State, Local not specified - PPO | Source: Ambulatory Visit | Attending: Medical | Admitting: Medical

## 2019-03-19 ENCOUNTER — Other Ambulatory Visit: Payer: Self-pay

## 2019-03-19 ENCOUNTER — Encounter: Payer: Self-pay | Admitting: Medical

## 2019-03-19 VITALS — BP 128/84 | HR 62 | Temp 97.5°F | Wt 244.2 lb

## 2019-03-19 DIAGNOSIS — S90454A Superficial foreign body, right lesser toe(s), initial encounter: Secondary | ICD-10-CM | POA: Diagnosis not present

## 2019-03-19 DIAGNOSIS — L089 Local infection of the skin and subcutaneous tissue, unspecified: Secondary | ICD-10-CM

## 2019-03-19 DIAGNOSIS — S99921A Unspecified injury of right foot, initial encounter: Secondary | ICD-10-CM | POA: Diagnosis not present

## 2019-03-19 HISTORY — DX: Local infection of the skin and subcutaneous tissue, unspecified: L08.9

## 2019-03-19 MED ORDER — CEPHALEXIN 500 MG PO CAPS: 500.0000 mg | ORAL_CAPSULE | Freq: Three times a day (TID) | ORAL | 0 refills | Status: DC

## 2019-03-19 NOTE — Progress Notes (Signed)
Subjective:  Bryan Allen is a 49 y.o. male who presents for Chief Complaint  Patient presents with  . other    i office acute visit for pain in big toe on the rt. foot,      Visit today for pain in the right big toe.  He has had pain in the lateral aspect of the right big toe for 2 to 3 weeks.  He is concerned about possible splinter.  He does not recall the specific item that may be in his toe but his wife was trying to explore with a needle and felt like there could be something in his toe.  There has been a raised dark brown area on the outside of the toe.  It feels like it presses inward when he pushes on the area.  There was a little pus drainage the other day when they were messing with the wound with a needle.  No fever no nausea no vomiting.  No body aches or chills.  No other aggravating or relieving factors.  No other c/o.  The following portions of the patient's history were reviewed and updated as appropriate: allergies, current medications, past family history, past medical history, past social history, past surgical history and problem list.  ROS Otherwise as in subjective above    Objective: BP 128/84   Pulse 62   Temp (!) 97.5 F (36.4 C)   Wt 244 lb 3.2 oz (110.8 kg)   BMI 37.13 kg/m   General appearance: alert, no distress, well developed, well nourished, African-American male Right great toe, medial surface of the IP joint with a 5 mm diameter slightly raised somewhat dense lesion with a yellowish hue, there is a dense darker brown center, the whole area is somewhat tender when pressed.  There is no erythema no fluctuance no warmth.  No pus.  The rest of the toes nontender, normal appearing, normal range of motion without pain.  No swelling otherwise. Toe and foot neurovascularly intact    Assessment: Encounter Diagnoses  Name Primary?  . Foreign body of toe of right foot, initial encounter Yes  . Splinter of toe of right foot with infection, initial  encounter      Plan: We discussed his symptoms and exam findings.  We discussed the possibility of foreign body.  He agreed to incision and exploration for possible foreign body.  Cleaned and prepped the toe in usual sterile fashion.  Use local anesthesia 1% lidocaine without epinephrine.   Used a #15 scalpel to make a small incision and gradually scraped away at the surface callused skin.   I could feel what seemed to be a foreign body beneath the skin.  After careful exploration I was able to grasp a 5 mm long by 1 mm diameter metal pointed foreign body and remove it in its entirety.  It was embedded perpendicular to the skin towards the joint.  There was purulent discharge as the foreign body was being uncovered.  I cultured the pus.  Irrigated the wound with high-pressure saline.  No other obvious foreign body seen or felt.  Cleaned the toe and covered with sterile bandage.  Estimated blood loss less than 3 cc.  We discussed wound care, go for x-ray to rule out any other metallic foreign body.  Begin Keflex antibiotic.  Advised if any signs of worsening infection the next few days to call right away as we are not sure how far down into the toe the metal foreign body was  embedded.  I do not believe it entered the joint space but it is unclear.  We will call with x-ray results and culture results.  [name] was seen today for other.  Diagnoses and all orders for this visit:  Foreign body of toe of right foot, initial encounter -     DG Toe Great Right; Future -     WOUND CULTURE  Splinter of toe of right foot with infection, initial encounter -     DG Toe Great Right; Future -     WOUND CULTURE  Other orders -     cephALEXin (KEFLEX) 500 MG capsule; Take 1 capsule (500 mg total) by mouth 3 (three) times daily.    Follow up: Pending x-ray and culture

## 2019-03-19 NOTE — Patient Instructions (Addendum)
Please go to [facility] for your toe xray.   Their hours are 8am - 4:30 pm Monday - Friday.  Take your insurance card with you.  [facility] [id number]  [place] [address], [place], [place] [address]  [address], [place] [address]    Begin Keflex antibiotic  Keep the toe clean with soap and water  Avoid getting the toe dirty   Recheck if redness, pain, swelling, fever.

## 2019-03-21 LAB — WOUND CULTURE: Organism ID, Bacteria: NONE SEEN

## 2019-04-02 ENCOUNTER — Ambulatory Visit: Payer: Federal, State, Local not specified - PPO | Admitting: Medical

## 2019-04-16 ENCOUNTER — Telehealth: Payer: Self-pay | Admitting: Medical

## 2019-04-17 ENCOUNTER — Ambulatory Visit: Payer: Federal, State, Local not specified - PPO | Admitting: Medical

## 2019-04-23 ENCOUNTER — Encounter (HOSPITAL_BASED_OUTPATIENT_CLINIC_OR_DEPARTMENT_OTHER): Payer: Federal, State, Local not specified - PPO | Admitting: Internal Medicine

## 2019-04-24 ENCOUNTER — Other Ambulatory Visit: Payer: Self-pay

## 2019-04-24 ENCOUNTER — Ambulatory Visit (HOSPITAL_BASED_OUTPATIENT_CLINIC_OR_DEPARTMENT_OTHER): Payer: Federal, State, Local not specified - PPO | Attending: Family Medicine | Admitting: Internal Medicine

## 2019-04-24 VITALS — Ht 68.0 in | Wt 237.0 lb

## 2019-04-24 DIAGNOSIS — R0683 Snoring: Secondary | ICD-10-CM | POA: Insufficient documentation

## 2019-04-24 DIAGNOSIS — R0902 Hypoxemia: Secondary | ICD-10-CM | POA: Insufficient documentation

## 2019-04-24 DIAGNOSIS — G4733 Obstructive sleep apnea (adult) (pediatric): Secondary | ICD-10-CM | POA: Insufficient documentation

## 2019-05-04 NOTE — Procedures (Signed)
   Patient Name: Bryan Allen, Bryan Allen Date: 04/24/2019 Gender: Male D.O.B: [birth year] Age (years): 41 Referring Provider: Denita Lung Height (inches): 45 Interpreting Physician: Baird Lyons MD, ABSM Weight (lbs): 237 RPSGT: Jacolyn Reedy BMI: 36 MRN: [id number] Neck Size: 17.00  CLINICAL INFORMATION Sleep Study Type: HST Indication for sleep study: OSA Epworth Sleepiness Score: 6  SLEEP STUDY TECHNIQUE A multi-channel overnight portable sleep study was performed. The channels recorded were: nasal airflow, thoracic respiratory movement, and oxygen saturation with a pulse oximetry. Snoring was also monitored.  MEDICATIONS Patient self administered medications include: none reported.  SLEEP ARCHITECTURE Patient was studied for 344.5 minutes. The sleep efficiency was 93.3 % and the patient was supine for 0%. The arousal index was 0.0 per hour.  RESPIRATORY PARAMETERS The overall AHI was 23.2 per hour, with a central apnea index of 0.0 per hour. The oxygen nadir was 68% during sleep.  CARDIAC DATA Mean heart rate during sleep was 94.7 bpm.  IMPRESSIONS - Moderate obstructive sleep apnea occurred during this study (AHI = 23.2/h). - No significant central sleep apnea occurred during this study (CAI = 0.0/h). - Oxygen desaturation was noted during this study (Min O2 = 68%).  Mean O2 sat 90%. - Time with O2 saturation 89% or less was 49.6 minutes. - Patient snored.  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 Otherwise consider autopap or other options based on clinical judgment. - Be careful with alcohol, sedatives and other CNS depressants that may worsen sleep apnea and disrupt normal sleep architecture. - Sleep hygiene should be reviewed to assess factors that may improve  sleep quality. - Weight management and regular exercise should be initiated or continued.  [Electronically signed] 05/04/2019 10:29 AM  Baird Lyons MD, ABSM Diplomate, American Board of Sleep Medicine   NPI: [id number]                          [place]  ELECTRONICALLY SIGNED ON:  05/04/2019, 10:23 AM [place] PH: [phone]   FX: [phone] [place]

## 2019-05-05 DIAGNOSIS — G4733 Obstructive sleep apnea (adult) (pediatric): Secondary | ICD-10-CM

## 2019-05-20 NOTE — Progress Notes (Signed)
I think this sleep study report was suppose to come to you as PCP

## 2019-05-29 DIAGNOSIS — G4733 Obstructive sleep apnea (adult) (pediatric): Secondary | ICD-10-CM | POA: Diagnosis not present

## 2019-06-07 ENCOUNTER — Telehealth: Payer: Self-pay | Admitting: Medical

## 2019-06-07 NOTE — Telephone Encounter (Signed)
If he is feeling sick, body aches, chills or worried about infection he should go get a Covid test or do a consult  If the normally has allergy problems this time year then I would start with over-the-counter Flonase nasal spray and/or over-the-counter Zyrtec allergy pill at bedtime.  These usually work pretty well  If needed we can make an appointment to discuss further

## 2019-06-07 NOTE — Telephone Encounter (Signed)
Pt called and wants to see if he could get something called in for allergies pt said he is having nasal congestion and drainage

## 2019-06-07 NOTE — Telephone Encounter (Signed)
Pt advised.

## 2019-06-28 DIAGNOSIS — G4733 Obstructive sleep apnea (adult) (pediatric): Secondary | ICD-10-CM | POA: Diagnosis not present

## 2019-07-23 ENCOUNTER — Encounter: Payer: Federal, State, Local not specified - PPO | Admitting: Medical

## 2019-07-29 DIAGNOSIS — G4733 Obstructive sleep apnea (adult) (pediatric): Secondary | ICD-10-CM | POA: Diagnosis not present

## 2019-08-01 ENCOUNTER — Emergency Department (HOSPITAL_COMMUNITY)
Admission: EM | Admit: 2019-08-01 | Discharge: 2019-08-02 | Discharge status: Against Medical Advice | Payer: Federal, State, Local not specified - PPO | Attending: Emergency Medicine | Admitting: Emergency Medicine

## 2019-08-01 ENCOUNTER — Encounter (HOSPITAL_COMMUNITY): Payer: Self-pay

## 2019-08-01 ENCOUNTER — Other Ambulatory Visit: Payer: Self-pay

## 2019-08-01 DIAGNOSIS — Z5321 Procedure and treatment not carried out due to patient leaving prior to being seen by health care provider: Secondary | ICD-10-CM | POA: Diagnosis not present

## 2019-08-01 DIAGNOSIS — H9201 Otalgia, right ear: Secondary | ICD-10-CM | POA: Insufficient documentation

## 2019-08-01 NOTE — ED Triage Notes (Signed)
Pt arrives to ED w/ c/o 8/10 R ear pain that started this afternoon.

## 2019-08-01 NOTE — ED Notes (Signed)
No answer for room 

## 2019-08-02 DIAGNOSIS — H6501 Acute serous otitis media, right ear: Secondary | ICD-10-CM | POA: Diagnosis not present

## 2019-08-06 ENCOUNTER — Encounter: Payer: Federal, State, Local not specified - PPO | Admitting: Medical

## 2019-08-08 ENCOUNTER — Encounter: Payer: Self-pay | Admitting: Medical

## 2019-08-28 DIAGNOSIS — G4733 Obstructive sleep apnea (adult) (pediatric): Secondary | ICD-10-CM | POA: Diagnosis not present

## 2019-09-28 DIAGNOSIS — G4733 Obstructive sleep apnea (adult) (pediatric): Secondary | ICD-10-CM | POA: Diagnosis not present

## 2019-10-15 ENCOUNTER — Encounter: Payer: Federal, State, Local not specified - PPO | Admitting: Medical

## 2019-10-24 DIAGNOSIS — F419 Anxiety disorder, unspecified: Secondary | ICD-10-CM | POA: Diagnosis not present

## 2019-10-25 DIAGNOSIS — Z20822 Contact with and (suspected) exposure to covid-19: Secondary | ICD-10-CM | POA: Diagnosis not present

## 2019-10-29 DIAGNOSIS — G4733 Obstructive sleep apnea (adult) (pediatric): Secondary | ICD-10-CM | POA: Diagnosis not present

## 2019-11-06 DIAGNOSIS — Z20822 Contact with and (suspected) exposure to covid-19: Secondary | ICD-10-CM | POA: Diagnosis not present

## 2019-11-06 DIAGNOSIS — Z03818 Encounter for observation for suspected exposure to other biological agents ruled out: Secondary | ICD-10-CM | POA: Diagnosis not present

## 2019-11-07 DIAGNOSIS — Z03818 Encounter for observation for suspected exposure to other biological agents ruled out: Secondary | ICD-10-CM | POA: Diagnosis not present

## 2019-11-07 DIAGNOSIS — Z20822 Contact with and (suspected) exposure to covid-19: Secondary | ICD-10-CM | POA: Diagnosis not present

## 2019-11-20 ENCOUNTER — Ambulatory Visit (INDEPENDENT_AMBULATORY_CARE_PROVIDER_SITE_OTHER): Payer: Federal, State, Local not specified - PPO | Admitting: Medical

## 2019-11-20 ENCOUNTER — Other Ambulatory Visit: Payer: Self-pay

## 2019-11-20 ENCOUNTER — Encounter: Payer: Self-pay | Admitting: Medical

## 2019-11-20 VITALS — BP 134/96 | HR 85 | Ht 68.0 in | Wt 244.8 lb

## 2019-11-20 DIAGNOSIS — Z7185 Encounter for immunization safety counseling: Secondary | ICD-10-CM

## 2019-11-20 DIAGNOSIS — G4733 Obstructive sleep apnea (adult) (pediatric): Secondary | ICD-10-CM

## 2019-11-20 DIAGNOSIS — Z6837 Body mass index (BMI) 37.0-37.9, adult: Secondary | ICD-10-CM

## 2019-11-20 DIAGNOSIS — I10 Essential (primary) hypertension: Secondary | ICD-10-CM

## 2019-11-20 DIAGNOSIS — Z6835 Body mass index (BMI) 35.0-35.9, adult: Secondary | ICD-10-CM | POA: Insufficient documentation

## 2019-11-20 DIAGNOSIS — Z7189 Other specified counseling: Secondary | ICD-10-CM

## 2019-11-20 DIAGNOSIS — R009 Unspecified abnormalities of heart beat: Secondary | ICD-10-CM

## 2019-11-20 DIAGNOSIS — Z125 Encounter for screening for malignant neoplasm of prostate: Secondary | ICD-10-CM

## 2019-11-20 DIAGNOSIS — Z1211 Encounter for screening for malignant neoplasm of colon: Secondary | ICD-10-CM | POA: Diagnosis not present

## 2019-11-20 DIAGNOSIS — Z0000 Encounter for general adult medical examination without abnormal findings: Secondary | ICD-10-CM

## 2019-11-20 DIAGNOSIS — J309 Allergic rhinitis, unspecified: Secondary | ICD-10-CM

## 2019-11-20 HISTORY — DX: Encounter for immunization safety counseling: Z71.85

## 2019-11-20 HISTORY — DX: Essential (primary) hypertension: I10

## 2019-11-20 NOTE — Progress Notes (Signed)
Done

## 2019-11-20 NOTE — Progress Notes (Signed)
Complete physical exam   Patient: Bryan Allen   DOB: [birth year]   49 y.o. Male  MRN: [id number] Visit Date: 11/20/2019  Today's healthcare provider: Dorothea Ogle, PA-C   Chief Complaint  Patient presents with  . Annual Exam    with fasting labs-colonoscopy   I,Bryan Allen,acting as a scribe for [employer], PA-C.,have documented all relevant documentation on the behalf of Dorothea Ogle, PA-C,as directed by  [employer], PA-C while in the presence of [employer], PA-C.  Subjective    Bryan Allen is a 49 y.o. male who presents today for a complete physical exam.   HPI    Annual Exam     Additional comments: with fasting labs-colonoscopy        Last edited by Edgar Frisk, CMA on 11/20/2019  2:18 PM. (History)      Sleep Apnea: use CPAP machine nightly. [facility] for CPAP   elevated BPs - blood pressure has been elevated the last few visits.  Lives with wife and 2 daughters.   Past Medical History:  Diagnosis Date  . Allergy    Past Surgical History:  Procedure Laterality Date  . NASAL SEPTUM SURGERY      Family Status  Relation Name Status  . Mother  Alive  . Father  Deceased  . Sister  Deceased  . Brother  Alive  . Daughter  Alive  . Son  Alive  . Brother  Alive  . Sister  Alive  . Sister  Alive  . Sister  Alive  . Sister  Alive  . Sister  Alive  . Sister  Alive  . Neg Hx  (Not Specified)   Family History  Problem Relation Age of Onset  . Cancer Mother        unsure of type  . Hypertension Mother   . Pneumonia Father 31       died of pneumonia  . Cancer Father 36       colon  . Hypertension Father   . Other Sister        died in [place]  . Heart disease Brother        congenital, heart transplant 2017  . Hypertension Brother   . Asthma Daughter   . Asthma Son   . Hypertension Brother   . Diabetes Neg Hx   . Stroke Neg Hx    No Known Allergies  Patient Care Team: Brenen Beigel, Camelia Eng, PA-C as PCP - General  (Family Medicine)   Medications: Outpatient Medications Prior to Visit  Medication Sig  . aspirin 500 MG tablet Take 500 mg by mouth every 6 (six) hours as needed for pain.  Marland Kitchen azithromycin (ZITHROMAX Z-PAK) 250 MG tablet Take 1 tablet (250 mg total) by mouth daily. Take 2 tablets on the first day of treatment. Then take 1 tablet per day for the next four days. (Patient not taking: Reported on 07/11/2017)  . cephALEXin (KEFLEX) 500 MG capsule Take 1 capsule (500 mg total) by mouth 3 (three) times daily.  Marland Kitchen erythromycin ophthalmic ointment Place a 1/2 inch ribbon of ointment into the lower eyelid. (Patient not taking: Reported on 01/29/2019)  . esomeprazole (NEXIUM) 20 MG capsule Take 1 capsule (20 mg total) by mouth daily for 14 days. (Patient not taking: Reported on 01/29/2019)  . fluticasone (FLONASE) 50 MCG/ACT nasal spray SPRAY 2 SPRAYS INTO EACH NOSTRIL EVERY DAY (Patient not taking: Reported on 09/24/2018)  . ipratropium (ATROVENT) 0.06 % nasal spray Place 2  sprays into both nostrils 4 (four) times daily. (Patient not taking: Reported on 07/11/2017)  . loratadine (CLARITIN) 10 MG tablet TAKE 1 TABLET BY MOUTH EVERY DAY (Patient not taking: Reported on 09/24/2018)  . ondansetron (ZOFRAN) 4 MG tablet Take 1 tablet (4 mg total) by mouth every 6 (six) hours. (Patient not taking: Reported on 05/01/2018)  . oseltamivir (TAMIFLU) 75 MG capsule Take 1 capsule (75 mg total) by mouth every 12 (twelve) hours. (Patient not taking: Reported on 07/11/2017)  . vitamin B-12 (CYANOCOBALAMIN) 500 MCG tablet Take 500 mcg by mouth daily.   No facility-administered medications prior to visit.    Review of Systems ROS as in subjective    Objective    BP (!) 134/96   Pulse 85   Ht 5\' 8"  (1.727 m)   Wt 244 lb 12.8 oz (111 kg)   SpO2 98%   BMI 37.22 kg/m   Physical Exam      General appearence: alert, no distress, WD/WN, African American HEENT: normocephalic, sclerae anicteric, PERRLA, EOMi, nares patent,  no discharge or erythema, pharynx normal Oral cavity: MMM, no lesions Neck: supple, no lymphadenopathy, no thyromegaly, no masses, no bruits Heart: RRR, normal S1, S2, no murmurs Lungs: CTA bilaterally, no wheezes, rhonchi, or rales Abdomen: +bs, soft, non tender, non distended, no masses, no hepatomegaly, no splenomegaly Back: non tender Musculoskeletal: nontender, no swelling, no obvious deformity Extremities: no edema, no cyanosis, no clubbing Pulses: 2+ symmetric, upper and lower extremities, normal cap refill Neurological: alert, oriented x 3, CN2-12 intact, strength normal upper extremities and lower extremities, sensation normal throughout, DTRs 2+ throughout, no cerebellar signs, gait normal Psychiatric: normal affect, behavior normal, pleasant  GU: normal male, circ, no mass, no hernia DRE: anus normal tone, prostate WNL    Last depression screening scores PHQ 2/9 Scores 11/20/2019 05/01/2018 04/19/2016  PHQ - 2 Score 0 0 0   Last fall risk screening Fall Risk  04/19/2016  Falls in the past year? No   Last Audit-C alcohol use screening No flowsheet data found. A score of 3 or more in women, and 4 or more in men indicates increased risk for alcohol abuse, EXCEPT if all of the points are from question 1    Assessment & Plan   Encounter Diagnoses  Name Primary?  . Encounter for health maintenance examination in adult Yes  . Screening for prostate cancer   . Screen for colon cancer   . Vaccine counseling   . OSA (obstructive sleep apnea)   . Allergic rhinitis, unspecified seasonality, unspecified trigger   . BMI 37.0-37.9, adult   . Elevated heart rate with elevated blood pressure and diagnosis of hypertension       Routine Health Maintenance and Physical Exam  Exercise Activities and Dietary recommendations Goals   None     Immunization History  Administered Date(s) Administered  . Influenza,inj,Quad PF,6+ Mos 05/01/2018  . Tdap 07/14/2014, 01/12/2016     Health Maintenance  Topic Date Due  . Hepatitis C Screening  Never done  . COVID-19 Vaccine (1) Never done  . HIV Screening  Never done  . COLONOSCOPY  Never done  . TETANUS/TDAP  01/11/2026  . INFLUENZA VACCINE  Discontinued    Discussed health benefits of physical activity, and encouraged him to engage in regular exercise appropriate for his age and condition.  Physical exam - discussed and counseled on healthy lifestyle, diet, exercise, preventative care, vaccinations, sick and well care, proper use of emergency dept and after  hours care, and addressed their concerns.    Health screening: See your eye doctor yearly for routine vision care. See your dentist yearly for routine dental care including hygiene visits twice yearly.  Cancer screening Colonoscopy:  Referred for screening colonoscopy  Discussed PSA, prostate exam, and prostate cancer screening risks/benefits.     Discussed skin surveillance    Vaccinations: Advised yearly influenza vaccine Patient declines influenza vaccine  Up to date on Covid and Td vaccine  Shingles vaccine:  I recommend you have a shingles vaccine to help prevent shingles or herpes zoster outbreak.   Please call your insurer to inquire about coverage for the Shingrix vaccine given in 2 doses.   Some insurers cover this vaccine after age 50, some cover this after age 8.  If your insurer covers this, then call to schedule appointment to have this vaccine here.   Acute issues discussed: none  Separate significant chronic issues discussed:  Sleep apnea-he reports compliance-we will try to get a copy of compliance report  Elevated blood pressures-my reading today was 140/80.  Advise he work on weight loss, healthy low-fat low-salt diet, regular exercise, more of a vegan diet  Obesity-work on efforts to lose weight through healthy diet and exercise.  He just came back from a cruise to [place] an the [place], so he ate quite a bit on the  [place], [place] [phone] (phone) [phone] (fax)  [place]

## 2019-11-21 LAB — COMPREHENSIVE METABOLIC PANEL
ALT: 102 IU/L — ABNORMAL HIGH (ref 0–44)
AST: 43 IU/L — ABNORMAL HIGH (ref 0–40)
Albumin/Globulin Ratio: 1.8 (ref 1.2–2.2)
Albumin: 4.9 g/dL (ref 4.0–5.0)
Alkaline Phosphatase: 70 IU/L (ref 44–121)
BUN/Creatinine Ratio: 10 (ref 9–20)
BUN: 11 mg/dL (ref 6–24)
Bilirubin Total: 0.4 mg/dL (ref 0.0–1.2)
CO2: 25 mmol/L (ref 20–29)
Calcium: 10 mg/dL (ref 8.7–10.2)
Chloride: 100 mmol/L (ref 96–106)
Creatinine, Ser: 1.08 mg/dL (ref 0.76–1.27)
GFR calc Af Amer: 92 mL/min/{1.73_m2} (ref >59)
GFR calc non Af Amer: 80 mL/min/{1.73_m2} (ref >59)
Globulin, Total: 2.7 g/dL (ref 1.5–4.5)
Glucose: 83 mg/dL (ref 65–99)
Potassium: 4.7 mmol/L (ref 3.5–5.2)
Sodium: 140 mmol/L (ref 134–144)
Total Protein: 7.6 g/dL (ref 6.0–8.5)

## 2019-11-21 LAB — CBC
Hematocrit: 44 % (ref 37.5–51.0)
Hemoglobin: 15 g/dL (ref 13.0–17.7)
MCH: 29.3 pg (ref 26.6–33.0)
MCHC: 34.1 g/dL (ref 31.5–35.7)
MCV: 86 fL (ref 79–97)
Platelets: 192 10*3/uL (ref 150–450)
RBC: 5.12 x10E6/uL (ref 4.14–5.80)
RDW: 13.6 % (ref 11.6–15.4)
WBC: 5.3 10*3/uL (ref 3.4–10.8)

## 2019-11-21 LAB — LIPID PANEL
Chol/HDL Ratio: 4.2 ratio (ref 0.0–5.0)
Cholesterol, Total: 181 mg/dL (ref 100–199)
HDL: 43 mg/dL (ref >39)
LDL Chol Calc (NIH): 115 mg/dL — ABNORMAL HIGH (ref 0–99)
Triglycerides: 128 mg/dL (ref 0–149)
VLDL Cholesterol Cal: 23 mg/dL (ref 5–40)

## 2019-11-21 LAB — PSA: Prostate Specific Ag, Serum: 0.5 ng/mL (ref 0.0–4.0)

## 2019-11-28 DIAGNOSIS — G4733 Obstructive sleep apnea (adult) (pediatric): Secondary | ICD-10-CM | POA: Diagnosis not present

## 2019-12-29 DIAGNOSIS — G4733 Obstructive sleep apnea (adult) (pediatric): Secondary | ICD-10-CM | POA: Diagnosis not present

## 2020-01-06 ENCOUNTER — Ambulatory Visit (AMBULATORY_SURGERY_CENTER): Payer: Self-pay | Admitting: *Deleted

## 2020-01-06 ENCOUNTER — Other Ambulatory Visit: Payer: Self-pay

## 2020-01-06 VITALS — Ht 68.0 in | Wt 240.0 lb

## 2020-01-06 DIAGNOSIS — Z1211 Encounter for screening for malignant neoplasm of colon: Secondary | ICD-10-CM

## 2020-01-06 MED ORDER — SUPREP BOWEL PREP KIT 17.5-3.13-1.6 GM/177ML PO SOLN: 1.0000 | Freq: Once | ORAL | 0 refills | Status: AC

## 2020-01-06 NOTE — Progress Notes (Signed)
cov vax x 2  No egg or soy allergy known to patient  No issues with past sedation with any surgeries or procedures no intubation problems in the past  No FH of Malignant Hyperthermia No diet pills per patient No home 02 use per patient  No blood thinners per patient  Pt denies issues with constipation  No A fib or A flutter  EMMI video to pt or via [place] 19 guidelines implemented in PV today with Pt and RN   Suprep  Coupon given to pt in PV today , Code to Pharmacy   Due to the COVID-19 pandemic we are asking patients to follow these guidelines. Please only bring one care partner. Please be aware that your care partner may wait in the car in the parking lot or if they feel like they will be too hot to wait in the car, they may wait in the lobby on the 4th floor. All care partners are required to wear a mask the entire time (we do not have any that we can provide them), they need to practice social distancing, and we will do a Covid check for all patient's and care partners when you arrive. Also we will check their temperature and your temperature. If the care partner waits in their car they need to stay in the parking lot the entire time and we will call them on their cell phone when the patient is ready for discharge so they can bring the car to the front of the building. Also all patient's will need to wear a mask into building.

## 2020-01-27 ENCOUNTER — Ambulatory Visit (AMBULATORY_SURGERY_CENTER): Payer: Federal, State, Local not specified - PPO | Admitting: Gastroenterology

## 2020-01-27 ENCOUNTER — Encounter: Payer: Self-pay | Admitting: Gastroenterology

## 2020-01-27 ENCOUNTER — Other Ambulatory Visit: Payer: Self-pay

## 2020-01-27 VITALS — BP 127/81 | HR 80 | Temp 98.0°F | Resp 19 | Ht 68.0 in | Wt 240.0 lb

## 2020-01-27 DIAGNOSIS — Z1211 Encounter for screening for malignant neoplasm of colon: Secondary | ICD-10-CM

## 2020-01-27 DIAGNOSIS — K621 Rectal polyp: Secondary | ICD-10-CM | POA: Diagnosis not present

## 2020-01-27 DIAGNOSIS — D128 Benign neoplasm of rectum: Secondary | ICD-10-CM

## 2020-01-27 MED ORDER — SODIUM CHLORIDE 0.9 % IV SOLN: 500.0000 mL | Freq: Once | INTRAVENOUS | Status: DC

## 2020-01-27 NOTE — Progress Notes (Signed)
Report given to PACU, vss 

## 2020-01-27 NOTE — Progress Notes (Signed)
Pt's states no medical or surgical changes since previsit or office visit. 

## 2020-01-27 NOTE — Patient Instructions (Signed)
Handout provided on polyps.   YOU HAD AN ENDOSCOPIC PROCEDURE TODAY AT THE [facility]:   Refer to the procedure report that was given to you for any specific questions about what was found during the examination.  If the procedure report does not answer your questions, please call your gastroenterologist to clarify.  If you requested that your care partner not be given the details of your procedure findings, then the procedure report has been included in a sealed envelope for you to review at your convenience later.  YOU SHOULD EXPECT: Some feelings of bloating in the abdomen. Passage of more gas than usual.  Walking can help get rid of the air that was put into your GI tract during the procedure and reduce the bloating. If you had a lower endoscopy (such as a colonoscopy or flexible sigmoidoscopy) you may notice spotting of blood in your stool or on the toilet paper. If you underwent a bowel prep for your procedure, you may not have a normal bowel movement for a few days.  Please Note:  You might notice some irritation and congestion in your nose or some drainage.  This is from the oxygen used during your procedure.  There is no need for concern and it should clear up in a day or so.  SYMPTOMS TO REPORT IMMEDIATELY:  Following lower endoscopy (colonoscopy or flexible sigmoidoscopy):  Excessive amounts of blood in the stool  Significant tenderness or worsening of abdominal pains  Swelling of the abdomen that is new, acute  Fever of 100F or higher  For urgent or emergent issues, a gastroenterologist can be reached at any hour by calling [phone]. Do not use MyChart messaging for urgent concerns.    DIET:  We do recommend a small meal at first, but then you may proceed to your regular diet.  Drink plenty of fluids but you should avoid alcoholic beverages for 24 hours.  ACTIVITY:  You should plan to take it easy for the rest of today and you should NOT DRIVE or use heavy  machinery until tomorrow (because of the sedation medicines used during the test).    FOLLOW UP: Our staff will call the number listed on your records 48-72 hours following your procedure to check on you and address any questions or concerns that you may have regarding the information given to you following your procedure. If we do not reach you, we will leave a message.  We will attempt to reach you two times.  During this call, we will ask if you have developed any symptoms of COVID 19. If you develop any symptoms (ie: fever, flu-like symptoms, shortness of breath, cough etc.) before then, please call [phone].  If you test positive for Covid 19 in the 2 weeks post procedure, please call and report this information to us.    If any biopsies were taken you will be contacted by phone or by letter within the next 1-3 weeks.  Please call us at [phone] if you have not heard about the biopsies in 3 weeks.    SIGNATURES/CONFIDENTIALITY: You and/or your care partner have signed paperwork which will be entered into your electronic medical record.  These signatures attest to the fact that that the information above on your After Visit Summary has been reviewed and is understood.  Full responsibility of the confidentiality of this discharge information lies with you and/or your care-partner.  

## 2020-01-27 NOTE — Progress Notes (Signed)
Called to room to assist during endoscopic procedure.  Patient ID and intended procedure confirmed with present staff. Received instructions for my participation in the procedure from the performing physician.  

## 2020-01-27 NOTE — Progress Notes (Signed)
Riki Sheer RN

## 2020-01-27 NOTE — Op Note (Signed)
[facility] Patient Name: Bryan Allen Procedure Date: 01/27/2020 2:43 PM MRN: [id number] Endoscopist: Thornton Park MD, MD Age: 49 Referring MD:  Date of Birth: 08/22/1970 Gender: Male Account #: [id number] Procedure:                Colonoscopy Indications:              Screening for colorectal malignant neoplasm, This                            is the patient's first colonoscopy                           No known family history of colon cancer or polyps Medicines:                Monitored Anesthesia Care Procedure:                Pre-Anesthesia Assessment:                           - Prior to the procedure, a History and Physical                            was performed, and patient medications and                            allergies were reviewed. The patient's tolerance of                            previous anesthesia was also reviewed. The risks                            and benefits of the procedure and the sedation                            options and risks were discussed with the patient.                            All questions were answered, and informed consent                            was obtained. Prior Anticoagulants: The patient has                            taken no previous anticoagulant or antiplatelet                            agents. ASA Grade Assessment: II - A patient with                            mild systemic disease. After reviewing the risks                            and benefits, the patient was deemed in  satisfactory condition to undergo the procedure.                           After obtaining informed consent, the colonoscope                            was passed under direct vision. Throughout the                            procedure, the patient's blood pressure, pulse, and                            oxygen saturations were monitored continuously. The                            Colonoscope was  introduced through the anus and                            advanced to the 3 cm into the ileum. A second                            forward view of the right colon was performed. The                            colonoscopy was performed without difficulty. The                            patient tolerated the procedure well. The quality                            of the bowel preparation was good. The terminal                            ileum, ileocecal valve, appendiceal orifice, and                            rectum were photographed. Scope In: 2:50:04 PM Scope Out: 3:03:36 PM Scope Withdrawal Time: 0 hours 10 minutes 30 seconds  Total Procedure Duration: 0 hours 13 minutes 32 seconds  Findings:                 The perianal and digital rectal examinations were                            normal.                           A 8 mm polyp was found in the rectum. The polyp was                            flat. The polyp was removed with a cold snare.                            Resection and retrieval were complete. Estimated  blood loss was minimal.                           The exam was otherwise without abnormality on                            direct and retroflexion views. Complications:            No immediate complications. Estimated blood loss:                            Minimal. Estimated Blood Loss:     Estimated blood loss was minimal. Impression:               - One 8 mm polyp in the rectum, removed with a cold                            snare. Resected and retrieved.                           - The examination was otherwise normal on direct                            and retroflexion views. Recommendation:           - Patient has a contact number available for                            emergencies. The signs and symptoms of potential                            delayed complications were discussed with the                            patient. Return to  normal activities tomorrow.                            Written discharge instructions were provided to the                            patient.                           - Resume previous diet.                           - Continue present medications.                           - Await pathology results.                           - Repeat colonoscopy date to be determined after                            pending pathology results are reviewed for  surveillance.                           - Emerging evidence supports eating a diet of                            fruits, vegetables, grains, calcium, and yogurt                            while reducing red meat and alcohol may reduce the                            risk of colon cancer.                           - Thank you for allowing me to be involved in your                            colon cancer prevention. Thornton Park MD, MD 01/27/2020 3:08:40 PM This report has been signed electronically.

## 2020-01-28 DIAGNOSIS — G4733 Obstructive sleep apnea (adult) (pediatric): Secondary | ICD-10-CM | POA: Diagnosis not present

## 2020-01-29 ENCOUNTER — Telehealth: Payer: Self-pay

## 2020-01-29 NOTE — Telephone Encounter (Signed)
  Follow up Call-  Call back number 01/27/2020  Post procedure Call Back phone  # [phone]  Permission to leave phone message Yes  Some recent data might be hidden     Patient questions:  Do you have a fever, pain , or abdominal swelling? No. Pain Score  0 *  Have you tolerated food without any problems? Yes.    Have you been able to return to your normal activities? Yes.    Do you have any questions about your discharge instructions: Diet   No. Medications  No. Follow up visit  No.  Do you have questions or concerns about your Care? No.  Actions: * If pain score is 4 or above: No action needed, pain <4.  1. Have you developed a fever since your procedure? no  2.   Have you had an respiratory symptoms (SOB or cough) since your procedure? no  3.   Have you tested positive for COVID 19 since your procedure no  4.   Have you had any family members/close contacts diagnosed with the COVID 19 since your procedure?  no   If yes to any of these questions please route to Joylene John, RN and Joella Prince, RN

## 2020-02-04 ENCOUNTER — Encounter: Payer: Self-pay | Admitting: Gastroenterology

## 2020-02-21 DIAGNOSIS — G4733 Obstructive sleep apnea (adult) (pediatric): Secondary | ICD-10-CM | POA: Diagnosis not present

## 2020-02-28 DIAGNOSIS — G4733 Obstructive sleep apnea (adult) (pediatric): Secondary | ICD-10-CM | POA: Diagnosis not present

## 2020-03-06 ENCOUNTER — Ambulatory Visit: Payer: Federal, State, Local not specified - PPO | Admitting: Medical

## 2020-03-09 ENCOUNTER — Ambulatory Visit: Payer: Federal, State, Local not specified - PPO | Admitting: Medical

## 2020-03-14 DIAGNOSIS — J029 Acute pharyngitis, unspecified: Secondary | ICD-10-CM | POA: Diagnosis not present

## 2020-03-14 DIAGNOSIS — Z20822 Contact with and (suspected) exposure to covid-19: Secondary | ICD-10-CM | POA: Diagnosis not present

## 2020-03-14 DIAGNOSIS — J069 Acute upper respiratory infection, unspecified: Secondary | ICD-10-CM | POA: Diagnosis not present

## 2020-03-30 DIAGNOSIS — G4733 Obstructive sleep apnea (adult) (pediatric): Secondary | ICD-10-CM | POA: Diagnosis not present

## 2020-05-21 DIAGNOSIS — G4733 Obstructive sleep apnea (adult) (pediatric): Secondary | ICD-10-CM | POA: Diagnosis not present

## 2020-06-10 ENCOUNTER — Telehealth: Payer: Self-pay | Admitting: Medical

## 2020-06-10 NOTE — Telephone Encounter (Signed)
Dismissal letter in guarantor snapshot  °

## 2020-06-22 DIAGNOSIS — G4733 Obstructive sleep apnea (adult) (pediatric): Secondary | ICD-10-CM | POA: Diagnosis not present

## 2020-06-25 ENCOUNTER — Ambulatory Visit: Payer: Federal, State, Local not specified - PPO | Admitting: Medical

## 2020-06-25 ENCOUNTER — Other Ambulatory Visit: Payer: Self-pay | Admitting: Family Medicine

## 2020-06-29 ENCOUNTER — Other Ambulatory Visit: Payer: Self-pay | Admitting: Family Medicine

## 2020-06-29 DIAGNOSIS — R7309 Other abnormal glucose: Secondary | ICD-10-CM | POA: Diagnosis not present

## 2020-06-29 DIAGNOSIS — R5383 Other fatigue: Secondary | ICD-10-CM | POA: Diagnosis not present

## 2020-06-29 DIAGNOSIS — H9213 Otorrhea, bilateral: Secondary | ICD-10-CM | POA: Diagnosis not present

## 2020-06-29 DIAGNOSIS — R350 Frequency of micturition: Secondary | ICD-10-CM | POA: Diagnosis not present

## 2020-06-29 DIAGNOSIS — R030 Elevated blood-pressure reading, without diagnosis of hypertension: Secondary | ICD-10-CM | POA: Diagnosis not present

## 2020-06-29 DIAGNOSIS — J302 Other seasonal allergic rhinitis: Secondary | ICD-10-CM | POA: Diagnosis not present

## 2020-06-29 DIAGNOSIS — Z13220 Encounter for screening for lipoid disorders: Secondary | ICD-10-CM | POA: Diagnosis not present

## 2020-07-22 DIAGNOSIS — G4733 Obstructive sleep apnea (adult) (pediatric): Secondary | ICD-10-CM | POA: Diagnosis not present

## 2020-07-31 ENCOUNTER — Encounter (HOSPITAL_COMMUNITY): Payer: Self-pay

## 2020-07-31 ENCOUNTER — Emergency Department (HOSPITAL_COMMUNITY)
Admission: EM | Admit: 2020-07-31 | Discharge: 2020-07-31 | Disposition: A | Discharge status: Home / Self Care | Payer: Federal, State, Local not specified - PPO | Attending: Emergency Medicine | Admitting: Emergency Medicine

## 2020-07-31 ENCOUNTER — Emergency Department (HOSPITAL_COMMUNITY): Payer: Federal, State, Local not specified - PPO

## 2020-07-31 ENCOUNTER — Other Ambulatory Visit: Payer: Self-pay

## 2020-07-31 DIAGNOSIS — Z7982 Long term (current) use of aspirin: Secondary | ICD-10-CM | POA: Insufficient documentation

## 2020-07-31 DIAGNOSIS — M25561 Pain in right knee: Secondary | ICD-10-CM | POA: Diagnosis not present

## 2020-07-31 DIAGNOSIS — I10 Essential (primary) hypertension: Secondary | ICD-10-CM | POA: Insufficient documentation

## 2020-07-31 MED ORDER — IBUPROFEN 800 MG PO TABS: 800.0000 mg | ORAL_TABLET | Freq: Two times a day (BID) | ORAL | 0 refills | Status: AC

## 2020-07-31 NOTE — ED Notes (Signed)
Patient transported to X-ray 

## 2020-07-31 NOTE — ED Triage Notes (Signed)
Right knee pain x more than 1 week. Denies any recent trauma/ fall.  Knee doesn't appear swollen/ red/ warm.

## 2020-07-31 NOTE — Discharge Instructions (Addendum)
Your imaging looks reassuring, I suspect you are suffering from a muscular strain but cannot exclude possibility of a ligament or tendon damage.  I started you on NSAIDs please take as prescribed.  I recommend placing warm compresses to the area and stretch at the knee as needed.  Please follow-up with orthopedic surgery and/or your PCP in 1 week's time for reevaluation.  Come back to the emergency department if you develop chest pain, shortness of breath, severe abdominal pain, uncontrolled nausea, vomiting, diarrhea.

## 2020-07-31 NOTE — ED Provider Notes (Signed)
[facility] EMERGENCY DEPARTMENT Provider Note   CSN: [id number] Arrival date & time: 07/31/20  7654     History Chief Complaint  Patient presents with  . Knee Pain    Bryan Allen is a 50 y.o. male.  HPI   Patient with no significant medical history presents to the emergency department with chief complaint of right-sided knee pain.  Patient states pain started suddenly 1 week ago,initially he states the pain was only when he was standing and walking but now the pain has progressed into hurting while lying down.  He denies  recent trauma to the area, has never had chronic chronic knee pain in the past, he does endorse that he has been walking more and exercising thinks this is possibly the cause.  He denies history of IV drug use, denies erythema or edema around the area, he denies paresthesia or weakness in the lower extremities, he has no history of DVTs or PEs, currently not on hormone therapy.  Patient has not tried any over-the-counter pain medications, denies  alleviating factors.  Patient denies headaches, fevers, chills, chest pain or shortness of breath.  Past Medical History:  Diagnosis Date  . Allergy   . Sleep apnea    with cpap     Patient Active Problem List   Diagnosis Date Noted  . Vaccine counseling 11/20/2019  . BMI 37.0-37.9, adult 11/20/2019  . Elevated heart rate with elevated blood pressure and diagnosis of hypertension 11/20/2019  . Splinter of toe of right foot with infection 03/19/2019  . OSA (obstructive sleep apnea) 05/01/2018  . Allergic rhinitis 05/01/2018  . 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 Encounter for health maintenance examination in adult 04/19/2016  . Screen for colon cancer 04/19/2016  . Class 1 obesity without serious comorbidity with body mass index (BMI) of 34.0 to 34.9 in adult 04/19/2016    Past Surgical History:  Procedure Laterality Date  . NASAL SEPTUM SURGERY    . WISDOM TOOTH EXTRACTION         Family  History  Problem Relation Age of Onset  . Cancer Mother        unsure of type  . Hypertension Mother   . Pneumonia Father 40       died of pneumonia  . Cancer Father 71       prostate  . Hypertension Father   . Prostate cancer Father   . Other Sister        died in [place]  . Heart disease Brother        congenital, heart transplant 2017  . Hypertension Brother   . Asthma Daughter   . Asthma Son   . Hypertension Brother   . Diabetes Neg Hx   . Stroke Neg Hx   . Colon cancer Neg Hx   . Colon polyps Neg Hx   . Esophageal cancer Neg Hx   . Rectal cancer Neg Hx   . Stomach cancer Neg Hx     Social History   Tobacco Use  . Smoking status: Never Smoker  . Smokeless tobacco: Never Used  Substance Use Topics  . Alcohol use: No  . Drug use: No    Home Medications Prior to Admission medications   Medication Sig Start Date End Date Taking? Authorizing Provider  ibuprofen (ADVIL) 800 MG tablet Take 1 tablet (800 mg total) by mouth 2 (two) times daily for 7 days. 07/31/20 08/07/20 Yes Marcello Fennel, PA-C  aspirin 81 MG chewable tablet  Chew 162 mg by mouth as needed.     [provider]    Allergies    Other  Review of Systems   Review of Systems  Constitutional: Negative for chills and fever.  HENT: Negative for congestion.   Respiratory: Negative for shortness of breath.   Cardiovascular: Negative for chest pain.  Gastrointestinal: Negative for abdominal pain.  Genitourinary: Negative for enuresis.  Musculoskeletal: Negative for back pain.       Right knee pain.  Skin: Negative for rash.  Neurological: Negative for headaches.  Hematological: Does not bruise/bleed easily.    Physical Exam Updated Vital Signs BP (!) 137/95 (BP Location: Left Arm)   Pulse 85   Temp 97.8 F (36.6 C)   Resp 17   Ht 5\' 8"  (1.727 m)   Wt 108.9 kg   SpO2 97%   BMI 36.50 kg/m   Physical Exam Vitals and nursing note reviewed.  Constitutional:      General: He is not in  acute distress.    Appearance: He is not ill-appearing.  HENT:     Head: Normocephalic and atraumatic.     Nose: No congestion.     Mouth/Throat:     Mouth: Mucous membranes are moist.     Pharynx: Oropharynx is clear.  Eyes:     Conjunctiva/sclera: Conjunctivae normal.  Cardiovascular:     Rate and Rhythm: Normal rate and regular rhythm.     Pulses: Normal pulses.     Heart sounds: No murmur heard. No friction rub. No gallop.   Pulmonary:     Effort: No respiratory distress.     Breath sounds: No wheezing, rhonchi or rales.  Musculoskeletal:        General: Tenderness present. No swelling.     Right lower leg: No edema.     Left lower leg: No edema.     Comments: Right leg was visualized there is no noted erythema or edema around the knee, he had full range of motion at the knee but endorsed pain with flexion and extension, he had 5 of 5 strength, neurovascular intact.  He was tender to palpation on the medial aspect, there is no deformities present, no unilateral leg swelling present.  No noted joint laxity with medial or lateral forces, anterior/ posterior forces either.  Skin:    General: Skin is warm and dry.  Neurological:     Mental Status: He is alert.  Psychiatric:        Mood and Affect: Mood normal.     ED Results / Procedures / Treatments   Labs (all labs ordered are listed, but only abnormal results are displayed) Labs Reviewed - No data to display  EKG None  Radiology DG Knee Complete 4 Views Right  Result Date: 07/31/2020 CLINICAL DATA:  50 year old male with history of tenderness in the medial aspect of the right knee. EXAM: RIGHT KNEE - COMPLETE 4+ VIEW COMPARISON:  No priors. FINDINGS: No evidence of fracture, dislocation, or joint effusion. No evidence of arthropathy or other focal bone abnormality. Soft tissues are unremarkable. IMPRESSION: Negative. Electronically Signed   By: Vinnie Langton M.D.   On: 07/31/2020 07:30    Procedures Procedures    Medications Ordered in ED Medications - No data to display  ED Course  I have reviewed the triage vital signs and the nursing notes.  Pertinent labs & imaging results that were available during my care of the patient were reviewed by me and considered in  my medical decision making (see chart for details).    MDM Rules/Calculators/A&P                         Initial impression-patient presents with right-sided knee pain.  He is alert, does not appear in acute distress, vital signs reassuring.  Will obtain imaging for further evaluation.  Work-up-right knee is negative for acute findings.  Rule out- I have low suspicion for septic arthritis as patient denies IV drug use, skin exam was performed no erythematous, edematous, warm joints noted on exam, no new heart murmur heard on exam.  Low suspicion for DVT as there is no unilateral leg swelling, no tenderness behind the calf, patient has low risk factors.  Low suspicion for fracture or dislocation as x-ray does not feel any significant findings. low suspicion for ligament or tendon damage as area was palpated no gross defects noted, he has full range of motion as well as 5/5 strength.  Low suspicion for compartment syndrome as area was palpated it was soft to the touch, neurovascular fully intact.   Plan-  1.  Right knee pain-I suspect is more muscular in nature but cannot exclude the possibility of a ligament damage, will start him on NSAIDs, follow-up with orthopedic surgery for further evaluation.  Vital signs have remained stable, no indication for hospital admission.  Patient given at home care as well strict return precautions.  Patient verbalized that they understood agreed to said plan.   Final Clinical Impression(s) / ED Diagnoses Final diagnoses:  Acute pain of right knee    Rx / DC Orders ED Discharge Orders         Ordered    ibuprofen (ADVIL) 800 MG tablet  2 times daily        07/31/20 0747           Marcello Fennel, PA-C 07/31/20 0750    Quintella Reichert, MD 07/31/20 1008

## 2020-08-19 ENCOUNTER — Ambulatory Visit: Payer: Federal, State, Local not specified - PPO | Admitting: Nurse Practitioner

## 2020-08-21 ENCOUNTER — Ambulatory Visit: Payer: Federal, State, Local not specified - PPO | Admitting: Nurse Practitioner

## 2020-08-21 ENCOUNTER — Other Ambulatory Visit: Payer: Self-pay

## 2020-08-21 ENCOUNTER — Encounter: Payer: Self-pay | Admitting: Nurse Practitioner

## 2020-08-21 VITALS — BP 124/84 | HR 97 | Temp 98.3°F | Ht 68.0 in | Wt 231.0 lb

## 2020-08-21 DIAGNOSIS — L918 Other hypertrophic disorders of the skin: Secondary | ICD-10-CM

## 2020-08-21 DIAGNOSIS — Z7689 Persons encountering health services in other specified circumstances: Secondary | ICD-10-CM

## 2020-08-21 DIAGNOSIS — M25561 Pain in right knee: Secondary | ICD-10-CM | POA: Diagnosis not present

## 2020-08-21 NOTE — Progress Notes (Signed)
Subjective:    Patient ID: Bryan Allen, male    DOB: 04-21-1970, 50 y.o.   MRN: [id number]  HPI: Bryan Allen is a 50 y.o. male presenting for new patient visit to establish care.  Introduced to Designer, jewellery role and practice setting.  All questions answered.  Discussed provider/patient relationship and expectations.  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 When sitting, pain is worse   KNEE PAIN Duration: weeks Involved knee: right Mechanism of injury: unknown Location:posterior, sometimes medial Onset: gradual Severity:  moderate to severe Quality:  sharp Frequency: constant Radiation: no Aggravating factors: sitting down too long, not extending Alleviating factors: rest, stretching, movement  Status: stable Treatments attempted:  rest Relief with NSAIDs?:  no Weakness with weight bearing or walking: yes Sensation of giving way: no Locking: no Popping: yes Bruising: no Swelling:  yes; in back Redness: no Paresthesias/decreased sensation: yes; in back of knee Fevers: no  SKIN TAGS Wondering if he can have skin tags on face removed. Duration: months Location: face Painful: no Itching: no Onset: gradual Context: not changing Associated signs and symptoms: none; no fever, drainage, change in color, size, tenderness History of skin cancer: no History of precancerous skin lesions: no Family history of skin cancer: no   OSA - wears CPAP as needed.  Has lost 20 lbs and does not think he needs it anymore.  Last sleep study was 1 year ago.   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  He is wondering if he has 3 consecutive normal blood pressure readings here if he can have a note reflecting this.   Allergies  Allergen Reactions   Other     Intolerance of flu shot    Outpatient Encounter Medications as of 08/21/2020   Medication Sig   [DISCONTINUED] aspirin 81 MG chewable tablet Chew 162 mg by mouth as needed.    No facility-administered encounter medications on file as of 08/21/2020.   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   Splinter of toe of right foot with infection 03/19/2019   Vaccine counseling 11/20/2019   Elevated heart rate with elevated blood pressure and diagnosis of hypertension 11/20/2019   Past Medical History:  Diagnosis Date   Allergy    Sleep apnea     Past Medical History:  Diagnosis Date   Allergy    Class 1 obesity without serious comorbidity with body mass index (BMI) of 34.0 to 34.9 in adult 04/19/2016   Elevated heart rate with elevated blood pressure and diagnosis of hypertension 11/20/2019   Encounter for health maintenance examination in adult 04/19/2016   Screen for colon cancer 04/19/2016   Sleep apnea    with cpap    Splinter of toe of right foot with infection 03/19/2019   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   Vaccine counseling 11/20/2019   Family History  Problem Relation Age of Onset   Cancer Mother  unsure of type   Hypertension Mother    Pneumonia Father 5       died of pneumonia   Cancer Father 25       prostate   Hypertension Father    Prostate cancer Father    Other Sister        died in [place]   Heart disease Brother        congenital, heart transplant 2017   Hypertension Brother    Asthma Daughter    Asthma Son    Hypertension Brother    Diabetes Neg Hx    Stroke Neg Hx    Colon cancer Neg Hx    Colon polyps Neg Hx    Esophageal  cancer Neg Hx    Rectal cancer Neg Hx    Stomach cancer Neg Hx    Past Surgical History:  Procedure Laterality Date   NASAL SEPTUM SURGERY     WISDOM TOOTH EXTRACTION     Social History   Tobacco Use   Smoking status: Never   Smokeless tobacco: Never  Vaping Use   Vaping Use: Never used  Substance Use Topics   Alcohol use: No   Drug use: No   Review of Systems Per HPI unless specifically indicated above     Objective:    BP 124/84   Pulse 97   Temp 98.3 F (36.8 C)   Ht 5\' 8"  (1.727 m)   Wt 231 lb (104.8 kg)   SpO2 94%   BMI 35.12 kg/m   Wt Readings from Last 3 Encounters:  08/21/20 231 lb (104.8 kg)  07/31/20 240 lb 1.3 oz (108.9 kg)  01/27/20 240 lb (108.9 kg)    Physical Exam Vitals and nursing note reviewed.  Constitutional:      General: He is not in acute distress.    Appearance: Normal appearance. He is not toxic-appearing.  HENT:     Head: Normocephalic and atraumatic.  Musculoskeletal:        General: Tenderness present. No deformity. Normal range of motion.     Right knee: Bony tenderness present. No swelling, effusion, erythema or ecchymosis. Normal range of motion. Tenderness present over the medial joint line. Normal alignment.     Left knee: Normal.     Right lower leg: No edema.     Left lower leg: No edema.  Skin:    General: Skin is warm and dry.     Capillary Refill: Capillary refill takes less than 2 seconds.     Coloration: Skin is not jaundiced or pale.     Findings: No erythema.     Comments: Multiple pinpoint raised, papules noted to face consistent with acrochordons  Neurological:     Mental Status: He is alert and oriented to person, place, and time.     Motor: No weakness.     Gait: Gait normal.  Psychiatric:        Mood and Affect: Mood normal.        Behavior: Behavior normal.        Thought Content: Thought content normal.        Judgment: Judgment normal.      Assessment & Plan:  1. Encounter to establish care  2.  Acute pain of right knee Acute.  Question if related to strain on 1 side of the body getting in and out of vehicle for work.  Recommend rest for the next 3 days, try stretches and knee compression/stabilizer brace.  Can also  try topical NSAID like Voltaren.  Return to clinic if pain worsens or if not improving.  Can consider referral to orthopedics at that time.  3. Acrochordon Patient with multiple skin tags on face Location recommend referral for discussion about removal.  Referral placed today.  - Ambulatory referral to Dermatology    Follow up plan: Return if symptoms worsen or fail to improve.

## 2020-08-22 DIAGNOSIS — G4733 Obstructive sleep apnea (adult) (pediatric): Secondary | ICD-10-CM | POA: Diagnosis not present

## 2020-08-24 ENCOUNTER — Ambulatory Visit: Payer: Federal, State, Local not specified - PPO | Admitting: Nurse Practitioner

## 2020-08-25 ENCOUNTER — Encounter: Payer: Self-pay | Admitting: Nurse Practitioner

## 2020-08-25 DIAGNOSIS — U071 COVID-19: Secondary | ICD-10-CM | POA: Diagnosis not present

## 2020-08-25 DIAGNOSIS — Z20822 Contact with and (suspected) exposure to covid-19: Secondary | ICD-10-CM | POA: Diagnosis not present

## 2020-09-24 ENCOUNTER — Ambulatory Visit (INDEPENDENT_AMBULATORY_CARE_PROVIDER_SITE_OTHER): Payer: Self-pay

## 2020-09-24 VITALS — BP 136/86

## 2020-09-24 DIAGNOSIS — Z0130 Encounter for examination of blood pressure without abnormal findings: Secondary | ICD-10-CM

## 2020-09-24 NOTE — Progress Notes (Signed)
Pt came in for blood pressure reading for DOT requirements approved by pcp. Needing 3 letters to indicate no htn, just white coat.

## 2020-09-29 ENCOUNTER — Encounter: Payer: Self-pay | Admitting: Nurse Practitioner

## 2020-09-30 NOTE — Telephone Encounter (Signed)
I see he has come in for a blood pressure check 1 time - has he been checking his blood pressure at home?

## 2020-10-09 NOTE — Telephone Encounter (Signed)
Called pt again to follow up on OT exam letter needed. LVM

## 2020-11-24 NOTE — Progress Notes (Deleted)
There were no vitals taken for this visit.   Subjective:    Patient ID: Bryan Allen, male    DOB: 1971/02/23, 50 y.o.   MRN: [id number]  HPI: Bryan Allen is a 50 y.o. male presenting on 11/25/2020 for comprehensive medical examination. Current medical complaints include:{Blank single:19197::"none","***"}  Interim Problems from his last visit: {Blank single:19197::"yes","no"}  Depression Screen done today and results listed below:  Depression screen [facility] 2/9 11/20/2019 05/01/2018 04/19/2016  Decreased Interest 0 0 0  Down, Depressed, Hopeless 0 0 0  PHQ - 2 Score 0 0 0    The patient {has/does not have:19849} a history of falls. I {did/did not:19850} complete a risk assessment for falls. A plan of care for falls {was/was not:19852} documented.   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   No current facility-administered medications on file prior to visit.    Allergies:  Allergies  Allergen Reactions   Other     Intolerance of flu shot    Social History:  Social History   Socioeconomic History   Marital status: Married    Spouse name: Not on file   Number of children: Not on file   Years of education: Not on file   Highest education level: Not on file  Occupational History   Not on  file  Tobacco Use   Smoking status: Never   Smokeless tobacco: Never  Vaping Use   Vaping Use: Never used  Substance and Sexual Activity   Alcohol use: No   Drug use: No   Sexual activity: Yes  Other Topics Concern   Not on file  Social History Narrative   Married, 2 children aged 73yo and 21yo, Fish farm manager for [employer], Christian, exercise - weights, cardio, biking.  10/2019   Social Determinants of Health   Financial Resource Strain: Not on file  Food Insecurity: Not on file  Transportation Needs: Not on file  Physical Activity: Not on file  Stress: Not on file  Social Connections: Not on file  Intimate Partner Violence: Not on file   Social History   Tobacco Use  Smoking Status Never  Smokeless Tobacco Never   Social History   Substance and Sexual Activity  Alcohol Use No    Family History:  Family History  Problem Relation Age of Onset   Cancer Mother        unsure of type   Hypertension Mother    Pneumonia Father 14       died of pneumonia   Cancer Father 29       prostate   Hypertension Father    Prostate cancer Father    Other Sister        died in [place]  Heart disease Brother        congenital, heart transplant 2017   Hypertension Brother    Asthma Daughter    Asthma Son    Hypertension Brother    Diabetes Neg Hx    Stroke Neg Hx    Colon cancer Neg Hx    Colon polyps Neg Hx    Esophageal cancer Neg Hx    Rectal cancer Neg Hx    Stomach cancer Neg Hx     Past medical history, surgical history, medications, allergies, family history and social history reviewed with patient today and changes made to appropriate areas of the chart.   ROS     Objective:    There were no vitals taken for this visit.  Wt Readings from Last 3 Encounters:  08/21/20 231 lb (104.8 kg)  07/31/20 240 lb 1.3 oz (108.9 kg)  01/27/20 240 lb (108.9 kg)    Physical Exam  Results for orders placed or performed in visit on 11/20/19  Comprehensive  metabolic panel  Result Value Ref Range   Glucose 83 65 - 99 mg/dL   BUN 11 6 - 24 mg/dL   Creatinine, Ser 1.08 0.76 - 1.27 mg/dL   GFR calc non Af Amer 80 >59 mL/min/1.73   GFR calc Af Amer 92 >59 mL/min/1.73   BUN/Creatinine Ratio 10 9 - 20   Sodium 140 134 - 144 mmol/L   Potassium 4.7 3.5 - 5.2 mmol/L   Chloride 100 96 - 106 mmol/L   CO2 25 20 - 29 mmol/L   Calcium 10.0 8.7 - 10.2 mg/dL   Total Protein 7.6 6.0 - 8.5 g/dL   Albumin 4.9 4.0 - 5.0 g/dL   Globulin, Total 2.7 1.5 - 4.5 g/dL   Albumin/Globulin Ratio 1.8 1.2 - 2.2   Bilirubin Total 0.4 0.0 - 1.2 mg/dL   Alkaline Phosphatase 70 44 - 121 IU/L   AST 43 (H) 0 - 40 IU/L   ALT 102 (H) 0 - 44 IU/L  CBC  Result Value Ref Range   WBC 5.3 3.4 - 10.8 x10E3/uL   RBC 5.12 4.14 - 5.80 x10E6/uL   Hemoglobin 15.0 13.0 - 17.7 g/dL   Hematocrit 44.0 37.5 - 51.0 %   MCV 86 79 - 97 fL   MCH 29.3 26.6 - 33.0 pg   MCHC 34.1 31.5 - 35.7 g/dL   RDW 13.6 11.6 - 15.4 %   Platelets 192 150 - 450 x10E3/uL  PSA  Result Value Ref Range   Prostate Specific Ag, Serum 0.5 0.0 - 4.0 ng/mL  Lipid panel  Result Value Ref Range   Cholesterol, Total 181 100 - 199 mg/dL   Triglycerides 128 0 - 149 mg/dL   HDL 43 >39 mg/dL   VLDL Cholesterol Cal 23 5 - 40 mg/dL   LDL Chol Calc (NIH) 115 (H) 0 - 99 mg/dL   Chol/HDL Ratio 4.2 0.0 - 5.0 ratio      Assessment & Plan:   Problem List Items Addressed This Visit   None    Discussed aspirin prophylaxis for myocardial infarction prevention and decision was {Blank single:19197::"it was not indicated","made to continue ASA","made to start ASA","made to stop ASA","that we recommended ASA, and patient refused"}  LABORATORY TESTING:  Health maintenance labs ordered today as discussed above.   The natural history of prostate cancer and ongoing controversy regarding screening and potential treatment outcomes of prostate cancer has been discussed with the patient. The meaning of a false positive PSA and  a  false negative PSA has been discussed. He indicates understanding of the limitations of this screening test and wishes *** to proceed with screening PSA testing.   IMMUNIZATIONS:   - Tdap: Tetanus vaccination status reviewed: {tetanus status:315746}. - Influenza: {Blank single:19197::"Up to date","Administered today","Postponed to flu season","Refused","Given elsewhere"} - Pneumovax: {Blank single:19197::"Up to date","Administered today","Not applicable","Refused","Given elsewhere"} - Prevnar: {Blank single:19197::"Up to date","Administered today","Not applicable","Refused","Given elsewhere"} - HPV: {Blank single:19197::"Up to date","Administered today","Not applicable","Refused","Given elsewhere"} - Zostavax vaccine: {Blank single:19197::"Up to date","Administered today","Not applicable","Refused","Given elsewhere"} - COVID-19 vaccine:  SCREENING: - Colonoscopy: {Blank single:19197::"Up to date","Ordered today","Not applicable","Refused","Done elsewhere"}  Discussed with patient purpose of the colonoscopy is to detect colon cancer at curable precancerous or early stages   - AAA Screening: {Blank single:19197::"Up to date","Ordered today","Not applicable","Refused","Done elsewhere"}  -Hearing Test: {Blank single:19197::"Up to date","Ordered today","Not applicable","Refused","Done elsewhere"}  -Spirometry: {Blank single:19197::"Up to date","Ordered today","Not applicable","Refused","Done elsewhere"}   PATIENT COUNSELING:    Sexuality: Discussed sexually transmitted diseases, partner selection, use of condoms, avoidance of unintended pregnancy  and contraceptive alternatives.   Advised to avoid cigarette smoking.  I discussed with the patient that most people either abstain from alcohol or drink within safe limits (<=14/week and <=4 drinks/occasion for males, <=7/weeks and <= 3 drinks/occasion for females) and that the risk for alcohol disorders and other health effects rises proportionally  with the number of drinks per week and how often a drinker exceeds daily limits.  Discussed cessation/primary prevention of drug use and availability of treatment for abuse.   Diet: Encouraged to adjust caloric intake to maintain  or achieve ideal body weight, to reduce intake of dietary saturated fat and total fat, to limit sodium intake by avoiding high sodium foods and not adding table salt, and to maintain adequate dietary potassium and calcium preferably from fresh fruits, vegetables, and low-fat dairy products.    stressed the importance of regular exercise  Injury prevention: Discussed safety belts, safety helmets, smoke detector, smoking near bedding or upholstery.   Dental health: Discussed importance of regular tooth brushing, flossing, and dental visits.   Follow up plan: NEXT PREVENTATIVE PHYSICAL DUE IN 1 YEAR. No follow-ups on file.

## 2020-11-25 ENCOUNTER — Encounter: Payer: Federal, State, Local not specified - PPO | Admitting: Nurse Practitioner

## 2021-03-17 ENCOUNTER — Encounter: Payer: Federal, State, Local not specified - PPO | Admitting: Nurse Practitioner

## 2021-03-24 ENCOUNTER — Encounter: Payer: Federal, State, Local not specified - PPO | Admitting: Nurse Practitioner

## 2021-03-31 ENCOUNTER — Encounter: Payer: Federal, State, Local not specified - PPO | Admitting: Nurse Practitioner

## 2021-04-07 ENCOUNTER — Encounter: Payer: Federal, State, Local not specified - PPO | Admitting: Nurse Practitioner

## 2021-07-11 ENCOUNTER — Encounter (HOSPITAL_COMMUNITY): Payer: Self-pay | Admitting: Emergency Medicine

## 2021-07-11 ENCOUNTER — Emergency Department (HOSPITAL_COMMUNITY)
Admission: EM | Admit: 2021-07-11 | Discharge: 2021-07-11 | Disposition: A | Discharge status: Home / Self Care | Payer: Federal, State, Local not specified - PPO | Attending: Emergency Medicine | Admitting: Emergency Medicine

## 2021-07-11 ENCOUNTER — Other Ambulatory Visit: Payer: Self-pay

## 2021-07-11 DIAGNOSIS — M5441 Lumbago with sciatica, right side: Secondary | ICD-10-CM | POA: Diagnosis not present

## 2021-07-11 DIAGNOSIS — M25551 Pain in right hip: Secondary | ICD-10-CM | POA: Diagnosis not present

## 2021-07-11 MED ORDER — PREDNISONE 20 MG PO TABS: 40.0000 mg | ORAL_TABLET | Freq: Every day | ORAL | 0 refills | Status: AC

## 2021-07-11 MED ORDER — LIDOCAINE 4 % EX PTCH: 1.0000 | MEDICATED_PATCH | Freq: Two times a day (BID) | CUTANEOUS | 0 refills | Status: AC | PRN

## 2021-07-11 NOTE — ED Triage Notes (Signed)
Pt c/o right hip pain that radiates down right leg. Denies injury/trauma, ambulatory without difficulty. ?

## 2021-07-11 NOTE — Discharge Instructions (Addendum)
You came to the ED today to be evaluated for your back pain.  Your history of present illness and physical exam are consistent with sciatica.  Due to this you were started on a short course of prednisone.  Additionally have given you lidocaine patches, please use these as prescribed.  You may take Tylenol and ibuprofen as indicated below.  Please follow-up with the orthopedic provider listed on this paperwork. ? ?I have given you a prescription for steroids today.  Some common side effects include feelings of extra energy, feeling warm, increased appetite, and stomach upset.  If you are diabetic your sugars may run higher than usual.  ? ? ?Get help right away if: ?You are not able to control when you urinate or have bowel movements (incontinence). ?You have: ?Weakness in your lower back, pelvis, buttocks, or legs that gets worse. ?Redness or swelling of your back. ?A burning sensation when you urinate. ?

## 2021-07-11 NOTE — ED Provider Notes (Signed)
?[facility] ?Provider Note ? ? ?CSN: [id number] ?Arrival date & time: 07/11/21  0609 ? ?  ? ?History ? ?Chief Complaint  ?Patient presents with  ? Hip Pain  ? ? ?Bryan Allen is a 51 y.o. male with a medical history of class I obesity, sleep apnea.  Presents to the emergency department with a chief complaint of right lumbar back pain.  Patient reports that pain started 2 weeks prior.  Denies any recent falls or injuries.  Patient states that pain radiates to his right lower leg.  Patient reports that pain is intermittent.  Patient has noticed that pain is worse with walking.  Patient reports that he has been told that he has sciatica in the past.   ? ?Denies any history of malignancy, IV drug use, numbness, weakness, saddle anesthesia, bowel/bladder dysfunction, fever, chills, dysuria, hematuria, urinary urgency, urinary frequency, swelling or tenderness to genitals, genital sores or lesions, penile discharge, abdominal pain, constipation, diarrhea. ? ? ?Hip Pain ?Pertinent negatives include no chest pain, no abdominal pain, no headaches and no shortness of breath.  ? ?  ? ?Home Medications ?Prior to Admission medications   ?Not on File  ?   ? ?Allergies    ?Other   ? ?Review of Systems   ?Review of Systems  ?Constitutional:  Negative for chills and fever.  ?Eyes:  Negative for visual disturbance.  ?Respiratory:  Negative for shortness of breath.   ?Cardiovascular:  Negative for chest pain.  ?Gastrointestinal:  Negative for abdominal pain, constipation, diarrhea, nausea and vomiting.  ?Genitourinary:  Negative for difficulty urinating, dysuria, flank pain, frequency, genital sores, hematuria, penile discharge, penile pain, penile swelling, scrotal swelling and testicular pain.  ?Musculoskeletal:  Positive for back pain. Negative for neck pain.  ?Skin:  Negative for color change and rash.  ?Neurological:  Negative for dizziness, tremors, seizures, syncope, facial asymmetry,  speech difficulty, light-headedness, numbness and headaches.  ?Psychiatric/Behavioral:  Negative for confusion.   ? ?Physical Exam ?Updated Vital Signs ?BP (!) 146/82   Pulse 100   Temp 97.7 ?F (36.5 ?C) (Oral)   Resp 16   SpO2 99%  ?Physical Exam ?Vitals and nursing note reviewed.  ?Constitutional:   ?   General: He is not in acute distress. ?   Appearance: He is not ill-appearing, toxic-appearing or diaphoretic.  ?HENT:  ?   Head: Normocephalic.  ?Eyes:  ?   General: No scleral icterus.    ?   Right eye: No discharge.     ?   Left eye: No discharge.  ?Cardiovascular:  ?   Rate and Rhythm: Normal rate.  ?   Pulses:     ?     Radial pulses are 2+ on the right side and 2+ on the left side.  ?Pulmonary:  ?   Effort: Pulmonary effort is normal.  ?Abdominal:  ?   General: Abdomen is protuberant. There is no distension. There are no signs of injury.  ?   Palpations: Abdomen is soft. There is no mass or pulsatile mass.  ?   Tenderness: There is no abdominal tenderness. There is no guarding or rebound.  ?Musculoskeletal:  ?   Cervical back: No swelling, edema, deformity, erythema, signs of trauma, lacerations, rigidity, spasms, torticollis, tenderness, bony tenderness or crepitus. No pain with movement. Normal range of motion.  ?   Thoracic back: No swelling, edema, deformity, signs of trauma, lacerations, spasms, tenderness or bony tenderness.  ?   Lumbar back:  Tenderness present. No swelling, edema, deformity, signs of trauma, lacerations, spasms or bony tenderness. Positive right straight leg raise test. Negative left straight leg raise test.  ?   Comments: Tenderness to right lumbar back  ?Skin: ?   General: Skin is warm and dry.  ?Neurological:  ?   General: No focal deficit present.  ?   Mental Status: He is alert and oriented to person, place, and time.  ?   GCS: GCS eye subscore is 4. GCS verbal subscore is 5. GCS motor subscore is 6.  ?   Comments: No facial asymmetry or dysarthria.  Moves all limbs equally  without difficulty  ?Psychiatric:     ?   Behavior: Behavior is cooperative.  ? ? ?ED Results / Procedures / Treatments   ?Labs ?(all labs ordered are listed, but only abnormal results are displayed) ?Labs Reviewed - No data to display ? ?EKG ?None ? ?Radiology ?No results found. ? ?Procedures ?Procedures  ? ? ?Medications Ordered in ED ?Medications - No data to display ? ?ED Course/ Medical Decision Making/ A&P ?  ?                        ?Medical Decision Making ?Risk ?OTC drugs. ?Prescription drug management. ? ? ?Alert 51 year old male no acute stress, nontoxic-appearing.  Presents to the ED with a chief complaint of right lumbar back pain that radiates into his lower extremity. ? ?Information obtained from patient.  Past medical records were reviewed including previous provider notes, labs, and imaging.  Patient has medical history as outlined in HPI which complicates his care. ? ?X-ray imaging of lumbar spine was considered however patient denies any fall or traumatic injury; low suspicion for acute osseous abnormality at this time. ? ?Cauda equina syndrome was considered however low suspicion at this time as patient denies any numbness, weakness, saddle anesthesia, bowel/bladder dysfunction; able to stand and ambulate without difficulty, able to move bilateral lower extremities without difficulty. ? ?Epidural abscess was considered however patient denies any fever, chills, IV drug use, is afebrile at this time. ? ?Tenderness to right lumbar back with positive right straight leg raise test.  Suspect sciatica.  No recent lab testing; and therefore we will hold any Toradol administration at this time.  We will start patient on short course of steroids, lidocaine patch.  Patient advised to use over-the-counter medications to help with his pain.  We will give patient information follow-up with orthopedic provider. ? ?Based on patient's chief complaint, I considered admission might be necessary, however after  reassuring ED workup feel patient is reasonable for discharge.  Discussed results, findings, treatment and follow up. Patient advised of return precautions. Patient verbalized understanding and agreed with plan. ? ?Portions of this note were generated with Lobbyist. Dictation errors may occur despite best attempts at proofreading. ? ? ? ? ? ? ? ? ?Final Clinical Impression(s) / ED Diagnoses ?Final diagnoses:  ?Acute right-sided low back pain with right-sided sciatica  ? ? ?Rx / DC Orders ?ED Discharge Orders   ? ?      Ordered  ?  predniSONE (DELTASONE) 20 MG tablet  Daily       ? 07/11/21 0757  ?  lidocaine (HM LIDOCAINE PATCH) 4 %  Every 12 hours PRN       ? 07/11/21 0757  ? ?  ?  ? ?  ? ? ?  ?Loni Beckwith, PA-C ?07/11/21 0815 ? ?  ?  Regan Lemming, MD ?07/11/21 [phone] ? ?

## 2021-07-16 ENCOUNTER — Ambulatory Visit: Payer: Federal, State, Local not specified - PPO | Admitting: Family Medicine

## 2021-07-25 IMAGING — CR DG KNEE COMPLETE 4+V*R*
4 series · 4 of 4 positions shown · non-contrast
Comparison: No priors.

CLINICAL DATA: 49-year-old male with history of tenderness in the
medial aspect of the right knee.

EXAM:
RIGHT KNEE - COMPLETE 4+ VIEW

[knee ap]
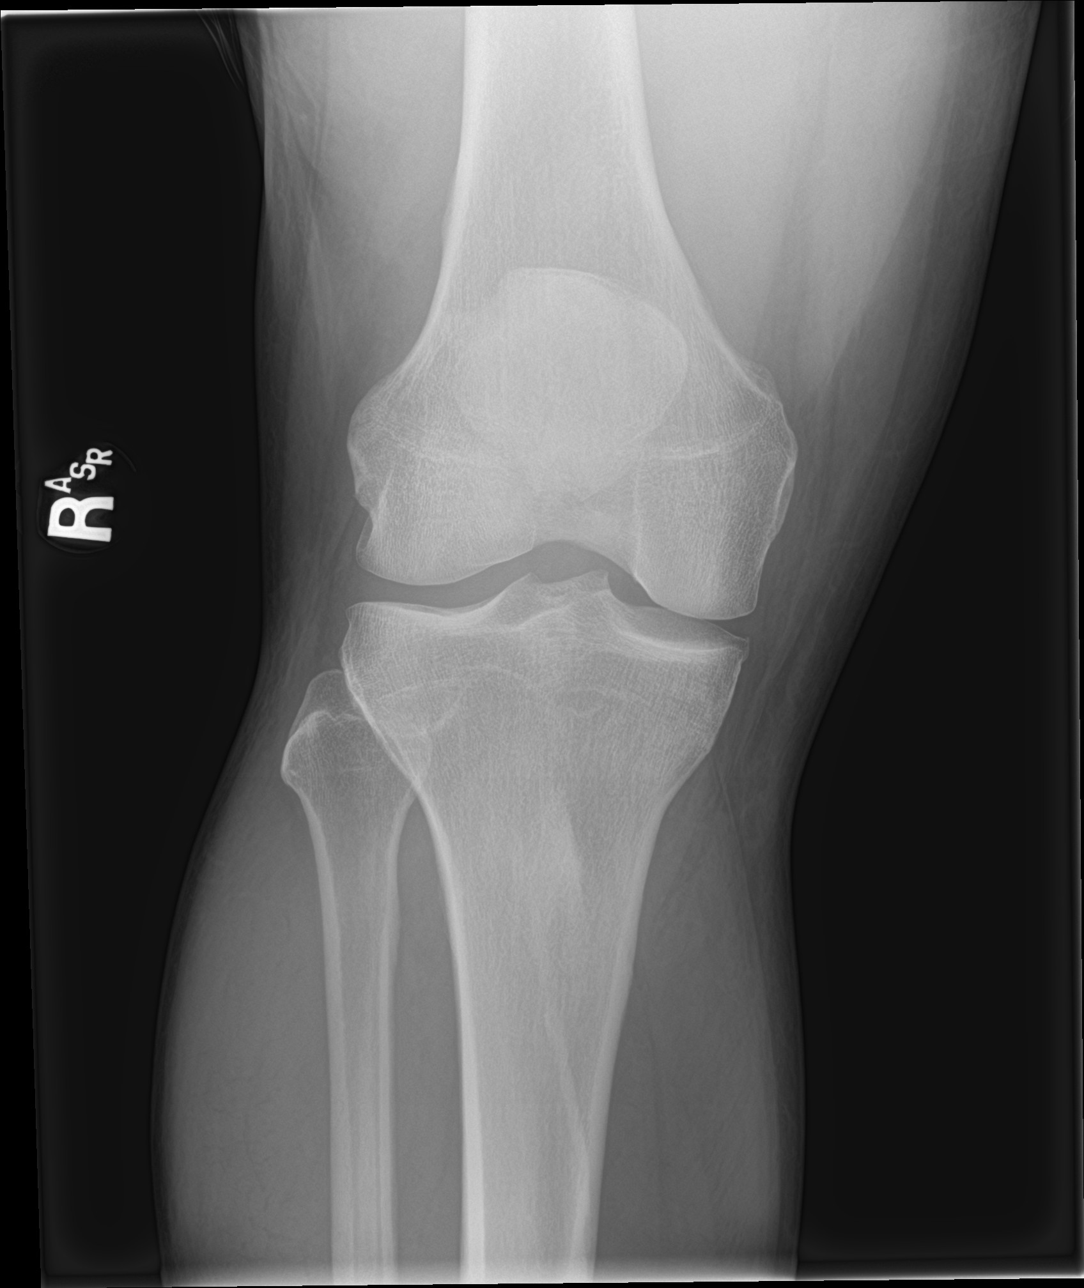

[knee lat]
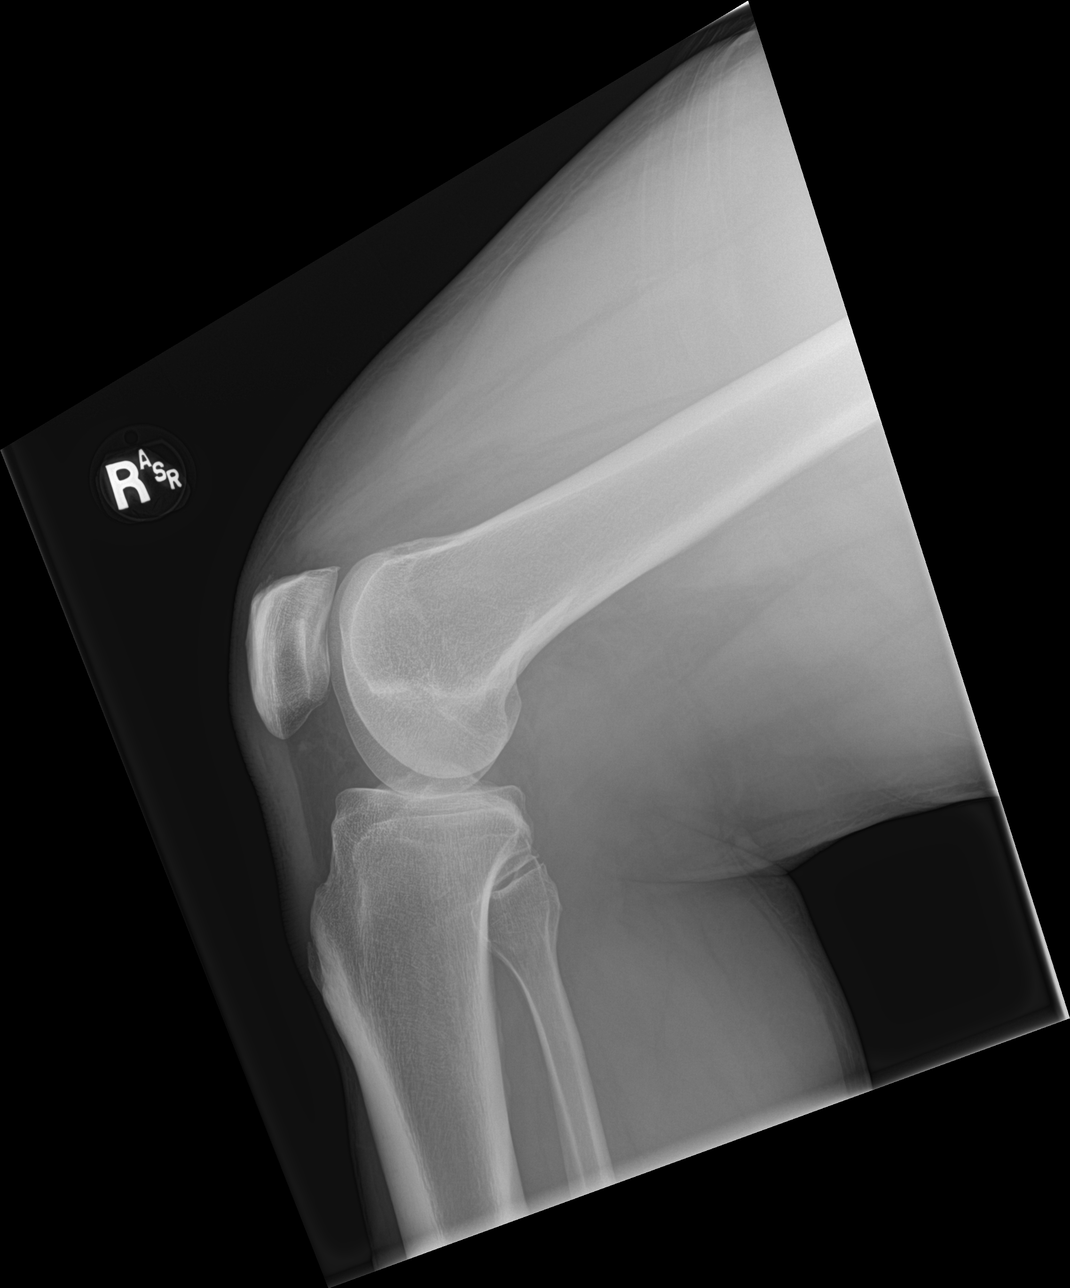

[knee obl (1 of 2)]
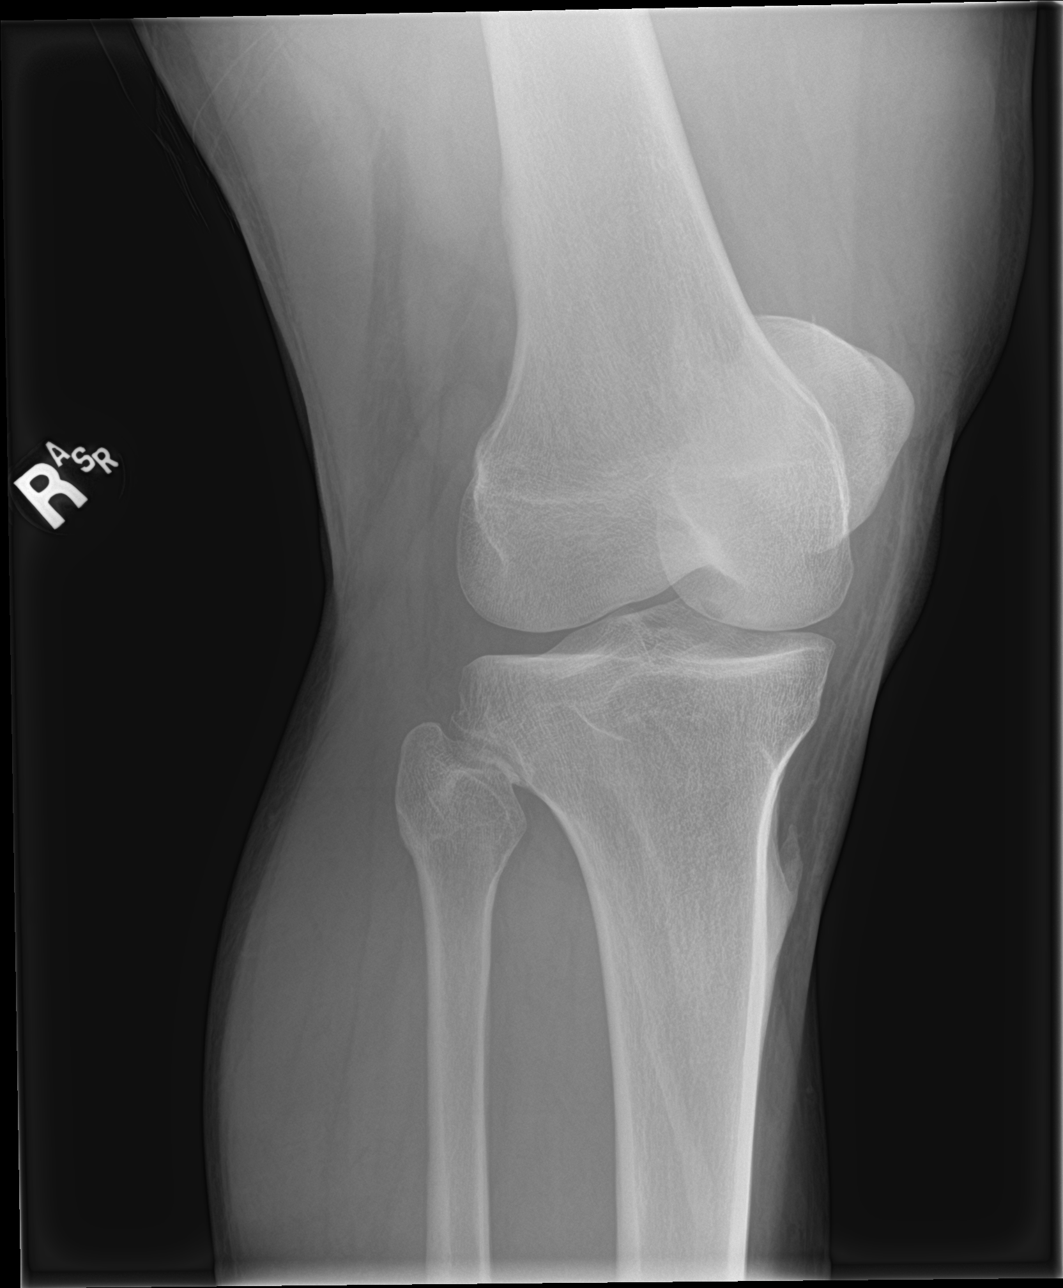

[knee obl (2 of 2)]
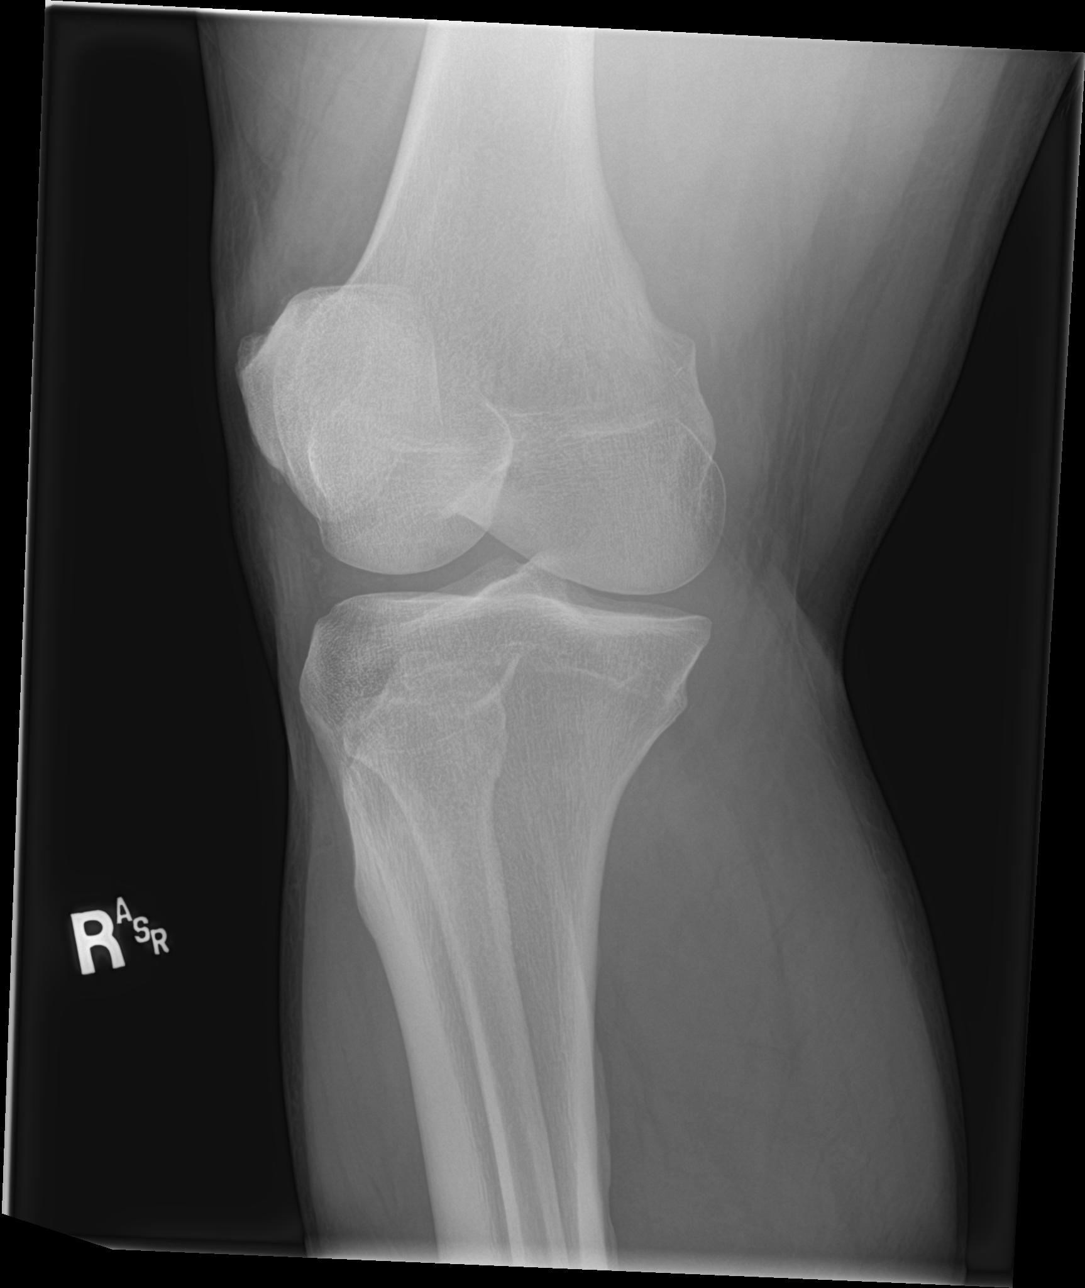

[4 of 4 positions shown; findings below may reference images not displayed]

FINDINGS: No evidence of fracture, dislocation, or joint effusion. No evidence
of arthropathy or other focal bone abnormality. Soft tissues are
unremarkable.
IMPRESSION: Negative.

## 2021-08-03 ENCOUNTER — Ambulatory Visit: Payer: Federal, State, Local not specified - PPO | Admitting: Family Medicine

## 2021-08-17 ENCOUNTER — Ambulatory Visit: Payer: Federal, State, Local not specified - PPO | Admitting: Family Medicine

## 2021-08-27 ENCOUNTER — Ambulatory Visit: Payer: Federal, State, Local not specified - PPO | Admitting: Family Medicine

## 2021-09-02 ENCOUNTER — Ambulatory Visit: Payer: Self-pay | Admitting: Internal Medicine

## 2021-09-13 ENCOUNTER — Ambulatory Visit: Payer: Federal, State, Local not specified - PPO | Admitting: Family Medicine

## 2021-10-19 ENCOUNTER — Ambulatory Visit: Payer: Self-pay | Admitting: Internal Medicine

## 2021-11-30 ENCOUNTER — Ambulatory Visit: Payer: Self-pay | Admitting: Internal Medicine

## 2021-12-28 ENCOUNTER — Ambulatory Visit: Payer: Self-pay | Admitting: Internal Medicine

## 2022-02-02 ENCOUNTER — Ambulatory Visit: Payer: Self-pay | Admitting: Internal Medicine

## 2022-02-06 DIAGNOSIS — J309 Allergic rhinitis, unspecified: Secondary | ICD-10-CM | POA: Diagnosis not present

## 2022-02-06 DIAGNOSIS — Z6838 Body mass index (BMI) 38.0-38.9, adult: Secondary | ICD-10-CM | POA: Diagnosis not present

## 2022-02-06 DIAGNOSIS — R0981 Nasal congestion: Secondary | ICD-10-CM | POA: Diagnosis not present

## 2022-03-23 ENCOUNTER — Ambulatory Visit: Payer: Self-pay | Admitting: Internal Medicine

## 2022-03-29 ENCOUNTER — Ambulatory Visit: Payer: Federal, State, Local not specified - PPO | Admitting: Internal Medicine

## 2022-03-30 ENCOUNTER — Telehealth: Payer: Self-pay | Admitting: Family

## 2022-03-30 ENCOUNTER — Ambulatory Visit: Payer: Federal, State, Local not specified - PPO | Admitting: Family

## 2022-03-30 ENCOUNTER — Encounter: Payer: Self-pay | Admitting: Family

## 2022-03-30 VITALS — BP 138/86 | HR 78 | Temp 97.5°F | Ht 68.0 in | Wt 247.0 lb

## 2022-03-30 DIAGNOSIS — Z0000 Encounter for general adult medical examination without abnormal findings: Secondary | ICD-10-CM | POA: Diagnosis not present

## 2022-03-30 DIAGNOSIS — H6122 Impacted cerumen, left ear: Secondary | ICD-10-CM | POA: Diagnosis not present

## 2022-03-30 DIAGNOSIS — J3089 Other allergic rhinitis: Secondary | ICD-10-CM

## 2022-03-30 LAB — CBC WITH DIFFERENTIAL/PLATELET
Basophils Absolute: 0 10*3/uL (ref 0.0–0.1)
Basophils Relative: 0.3 % (ref 0.0–3.0)
Eosinophils Absolute: 0.1 10*3/uL (ref 0.0–0.7)
Eosinophils Relative: 2 % (ref 0.0–5.0)
HCT: 41.6 % (ref 39.0–52.0)
Hemoglobin: 14.4 g/dL (ref 13.0–17.0)
Lymphocytes Relative: 49.7 % — ABNORMAL HIGH (ref 12.0–46.0)
Lymphs Abs: 2.2 10*3/uL (ref 0.7–4.0)
MCHC: 34.7 g/dL (ref 30.0–36.0)
MCV: 87.5 fl (ref 78.0–100.0)
Monocytes Absolute: 0.4 10*3/uL (ref 0.1–1.0)
Monocytes Relative: 9.9 % (ref 3.0–12.0)
Neutro Abs: 1.7 10*3/uL (ref 1.4–7.7)
Neutrophils Relative %: 38.1 % — ABNORMAL LOW (ref 43.0–77.0)
Platelets: 168 10*3/uL (ref 150.0–400.0)
RBC: 4.76 Mil/uL (ref 4.22–5.81)
RDW: 14.6 % (ref 11.5–15.5)
WBC: 4.5 10*3/uL (ref 4.0–10.5)

## 2022-03-30 LAB — LIPID PANEL
Cholesterol: 163 mg/dL (ref 0–200)
HDL: 38.5 mg/dL — ABNORMAL LOW (ref >39.00)
LDL Cholesterol: 100 mg/dL — ABNORMAL HIGH (ref 0–99)
NonHDL: 124.63
Total CHOL/HDL Ratio: 4
Triglycerides: 124 mg/dL (ref 0.0–149.0)
VLDL: 24.8 mg/dL (ref 0.0–40.0)

## 2022-03-30 LAB — COMPREHENSIVE METABOLIC PANEL
ALT: 68 U/L — ABNORMAL HIGH (ref 0–53)
AST: 35 U/L (ref 0–37)
Albumin: 4.6 g/dL (ref 3.5–5.2)
Alkaline Phosphatase: 50 U/L (ref 39–117)
BUN: 12 mg/dL (ref 6–23)
CO2: 27 mEq/L (ref 19–32)
Calcium: 9.5 mg/dL (ref 8.4–10.5)
Chloride: 106 mEq/L (ref 96–112)
Creatinine, Ser: 1 mg/dL (ref 0.40–1.50)
GFR: 86.52 mL/min (ref >60.00)
Glucose, Bld: 113 mg/dL — ABNORMAL HIGH (ref 70–99)
Potassium: 4.1 mEq/L (ref 3.5–5.1)
Sodium: 140 mEq/L (ref 135–145)
Total Bilirubin: 0.4 mg/dL (ref 0.2–1.2)
Total Protein: 7.1 g/dL (ref 6.0–8.3)

## 2022-03-30 LAB — PSA: PSA: 0.47 ng/mL (ref 0.10–4.00)

## 2022-03-30 LAB — TSH: TSH: 1.74 u[IU]/mL (ref 0.35–5.50)

## 2022-03-30 NOTE — Progress Notes (Signed)
Phone: [phone]  Subjective:  Patient 52 y.o. male presenting for annual physical.  Chief Complaint  Patient presents with   Establish Care    Would like a wellness check if possible. Fasting. Discuss allergy during spring and summer.    HPI: Allergic rhinitis:  reports having sneezing, nasal congestion, nasal drainage, sometimes scratchy throat, has tried Flonase, using several times per day, has not tried other meds. Reports sx are mild now, mostly when temperature changes, humidity, and spring/summer allergens.  See problem oriented charting- ROS- full  review of systems was completed and negative  except for: allergic rhinitis - noted in HPI above.  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   Vaccine counseling 11/20/2019   Patient Active Problem List   Diagnosis Date Noted   BMI 35.0-35.9,adult 11/20/2019   OSA (obstructive sleep apnea) 05/01/2018   Allergic rhinitis 05/01/2018   Past Surgical History:  Procedure Laterality Date   NASAL SEPTUM SURGERY     WISDOM TOOTH EXTRACTION      Family History  Problem Relation Age of Onset   Cancer Mother        unsure of type   Hypertension Mother    Pneumonia Father 45       died of pneumonia   Cancer Father 68       prostate   Hypertension Father    Prostate cancer Father    Other Sister        died in MVA   Heart disease Brother        congenital, heart transplant 2017   Hypertension Brother    Asthma Daughter    Asthma Son     Hypertension Brother    Diabetes Neg Hx    Stroke Neg Hx    Colon cancer Neg Hx    Colon polyps Neg Hx    Esophageal cancer Neg Hx    Rectal cancer Neg Hx    Stomach cancer Neg Hx     Medications- reviewed and updated Current Outpatient Medications  Medication Sig Dispense Refill   fluticasone (FLONASE) 50 MCG/ACT nasal spray Place into both nostrils.     lidocaine (HM LIDOCAINE PATCH) 4 % Place 1 patch onto the skin every 12 (twelve) hours as needed. (Patient not taking: Reported on 03/30/2022) 30 patch 0   No current facility-administered medications for this visit.    Allergies-reviewed and updated Allergies  Allergen Reactions   Other     Intolerance of flu shot    Social History   Social History Narrative   Married, 2 children aged 9yo and 21yo, Fish farm manager for [employer], Christian, exercise - weights, cardio, biking.  10/2019    Objective:  BP 138/86 (BP Location: Left Arm, Patient Position: Sitting, Cuff Size: Large)   Pulse 78   Temp (!) 97.5 F (36.4 C) (Temporal)   Ht '5\' 8"'$  (1.727 m)   Wt 247 lb (112 kg)   SpO2 100%   BMI 37.56  kg/m  Physical Exam Vitals and nursing note reviewed.  Constitutional:      General: He is not in acute distress.    Appearance: Normal appearance.  HENT:     Head: Normocephalic.     Right Ear: Tympanic membrane and external ear normal.     Left Ear: Tympanic membrane and external ear normal. There is impacted cerumen (mild erythema in canal after cerumen removed).     Nose: Nose normal.     Mouth/Throat:     Mouth: Mucous membranes are moist.  Eyes:     Extraocular Movements: Extraocular movements intact.  Cardiovascular:     Rate and Rhythm: Normal rate and regular rhythm.  Pulmonary:     Effort: Pulmonary effort is normal.     Breath sounds: Normal breath sounds.  Abdominal:     General: Abdomen is flat. There is no distension.     Palpations: Abdomen is soft.     Tenderness: There is no abdominal  tenderness.  Musculoskeletal:        General: Normal range of motion.     Cervical back: Normal range of motion.  Skin:    General: Skin is warm and dry.  Neurological:     Mental Status: He is alert and oriented to person, place, and time.  Psychiatric:        Mood and Affect: Mood normal.        Behavior: Behavior normal.        Judgment: Judgment normal.     Assessment and Plan   Health Maintenance counseling: 1. Anticipatory guidance: Patient counseled regarding regular dental exams q6 months, eye exams yearly, avoiding smoking and second hand smoke, limiting alcohol to 2 beverages per day.   2. Risk factor reduction:  Advised patient of need for regular exercise and diet rich in fruits and vegetables to reduce risk of heart attack and stroke. Exercise- most days, more weight training than cardio.   Wt Readings from Last 3 Encounters:  03/30/22 247 lb (112 kg)  08/21/20 231 lb (104.8 kg)  07/31/20 240 lb 1.3 oz (108.9 kg)   3. Immunizations/screenings/ancillary studies Immunization History  Administered Date(s) Administered   Influenza Split 05/01/2018   Influenza,inj,Quad PF,6+ Mos 05/01/2018   Tdap 07/14/2014, 01/12/2016   Health Maintenance Due  Topic Date Due   HIV Screening  Never done   Hepatitis C Screening  Never done   Zoster Vaccines- Shingrix (1 of 2) Never done    4. Prostate cancer screening >55yo - risk factors?  Lab Results  Component Value Date   PSA 0.4 04/19/2016   PSA 0.59 07/14/2014    5. Colon cancer screening:  due 2031 6. Skin cancer screening-  advised regular sunscreen use. Denies worrisome, changing, or new skin lesions.  7. Smoking associated screening (lung cancer screening, AAA screen 65-75, UA)- non smoker 8. STD screening - N/A 9. Alcohol screening: none  Problem List Items Addressed This Visit       Respiratory   Allergic rhinitis    chronic has Flonase using most days advised on proper use & SE, use for at least 2 weeks at a  time, bid for first few days, then qd use nasal saline spray tid and prior to medicated spray can add OTC oral generic anti-histamine if needed f/u prn      Other Visit Diagnoses     Annual physical exam    -  Primary   Relevant Orders   Comprehensive metabolic panel  TSH   Lipid panel   CBC with Differential/Platelet   PSA   Impacted cerumen of left ear    - Verbal consent received to perform left ear lavage via Hydrogen peroxide/water mix solution. Curette used to remove visible cerumen. Pt tolerated well, complete evacuation of all cerumen obtained. Mild erythema but no bleeding noted in ear canal after procedure.     Recommended follow up:  Return for any future concerns. Future Appointments  Date Time Provider [place]  05/27/2022  9:40 AM Teodora Medici, DO [facility]   Lab/Order associations: fasting    Jeanie Sewer, NP

## 2022-03-30 NOTE — Telephone Encounter (Signed)
Patient requests RX for fluticasone (FLONASE) 50 MCG/ACT nasal spray be sent to:   [place], [place] - [address] AT [place] Phone: [phone]  Fax: [phone]

## 2022-03-30 NOTE — Assessment & Plan Note (Signed)
chronic has Flonase using most days advised on proper use & SE, use for at least 2 weeks at a time, bid for first few days, then qd use nasal saline spray tid and prior to medicated spray can add OTC oral generic anti-histamine if needed f/u prn

## 2022-03-30 NOTE — Patient Instructions (Addendum)
Welcome to [facility], It was a pleasure meeting you today!   I will review your lab results via MyChart in a few days.  For your allergies: Use Nasal saline spray (i.e.,Simply Saline or other generic) or a nasal saline lavage (i.e.,NeilMed, Neti Pot) to flush allergens, general disinfecting &  prior to using medicated nasal sprays. Use Flonase or Nasacort nasal spray 1 spray each nostril twice a day for 3-5 days, then reduce to daily use. 2 sprays twice a day is ok for really bad symptoms for 1 week, then reduce to 1 spray twice a day or daily. May add over the counter antihistamines such as Xyzal (levocetirizine), Zyrtec (cetirizine), Claritin (loratadine), or Allegra (fexofenadine) daily as needed. May take twice a day if needed as long as it does not cause drowsiness or too much dryness in nose, mouth or throat. May also use Pataday or generic over the counter eye drops: 1 drop in each eye daily as needed for itchy/watery eyes.       PLEASE NOTE: If you had any LAB tests please let us know if you have not heard back within a few days. You may see your results on MyChart before we have a chance to review them but we will give you a call once they are reviewed by [place]. If we ordered any REFERRALS today, please let us know if you have not heard from their office within the next week.  Let us know through MyChart if you are needing REFILLS, or have your pharmacy send [place] the request. You can also use MyChart to communicate with me or any office staff.  Please try these tips to maintain a healthy lifestyle: It is important that you exercise regularly at least 30 minutes 5 times a week. Think about what you will eat, plan ahead. Choose whole foods, & think  "clean, green, fresh or frozen" over canned, processed or packaged foods which are more sugary, salty, and fatty. 70 to 75% of food eaten should be fresh vegetables and protein. 2-3  meals daily with healthy snacks  between meals, but must be whole fruit, protein or vegetables. Aim to eat over a 10 hour period when you are active, for example, 7am to 5pm, and then STOP after your last meal of the day, drinking only water.  Shorter eating windows, 6-8 hours, are showing benefits in heart disease and blood sugar regulation. Drink water every day! Shoot for 64 ounces daily = 8 cups, no other drink is as healthy! Fruit juice is best enjoyed in a healthy way, by EATING the fruit.

## 2022-03-31 ENCOUNTER — Telehealth: Payer: Self-pay | Admitting: Family

## 2022-03-31 MED ORDER — FLUTICASONE PROPIONATE 50 MCG/ACT NA SUSP: 1.0000 | Freq: Every day | NASAL | 5 refills | Status: AC

## 2022-03-31 NOTE — Telephone Encounter (Signed)
Pt would like a call back with lab results

## 2022-03-31 NOTE — Telephone Encounter (Signed)
RX sent

## 2022-03-31 NOTE — Telephone Encounter (Signed)
Patient called in yesterday to ask about the $35 co-pay he paid prior to having a physical. I called patient back today to explain that $35 copay was due since he was having a NP appointment. I explained that depending on insurance he will be billed for the office visit portion and the CPE portion should be covered. Pt verbalized understanding.

## 2022-03-31 NOTE — Addendum Note (Signed)
Addended byJeanie Sewer on: 03/31/2022 07:44 AM   Modules accepted: Orders

## 2022-04-03 NOTE — Progress Notes (Signed)
Your glucose (blood sugar),  is slightly elevated so I recommend scheduling a nurse visit to have your A1C checked. This is a 3 month average of your blood sugar and a better indicator of diabetes. Your electrolytes, kidney and liver function, blood count, thyroid, and cholesterol numbers are all good.  Just your bad # (LDL) is borderline & your good # (HDL) is slightly low. You can increase the HDL with exercise and eating more fish or taking a daily fish oil supplement (1,'000mg'$ ). Your PSA (prostate antigen test) is in normal range. Keep up the good work with controlling the saturated fat in your diet and continue to try and shoot for 30 minutes of exercise daily!

## 2022-04-03 NOTE — Telephone Encounter (Signed)
Lab results reviewed via MyChart, can call him if not read.

## 2022-04-10 ENCOUNTER — Emergency Department (HOSPITAL_COMMUNITY)
Admission: EM | Admit: 2022-04-10 | Discharge: 2022-04-10 | Disposition: A | Discharge status: Home / Self Care | Payer: Federal, State, Local not specified - PPO | Attending: Emergency Medicine | Admitting: Emergency Medicine

## 2022-04-10 DIAGNOSIS — R42 Dizziness and giddiness: Secondary | ICD-10-CM | POA: Diagnosis not present

## 2022-04-10 DIAGNOSIS — Z20822 Contact with and (suspected) exposure to covid-19: Secondary | ICD-10-CM | POA: Diagnosis not present

## 2022-04-10 LAB — RESP PANEL BY RT-PCR (RSV, FLU A&B, COVID)  RVPGX2
Influenza A by PCR: NEGATIVE
Influenza B by PCR: NEGATIVE
Resp Syncytial Virus by PCR: NEGATIVE
SARS Coronavirus 2 by RT PCR: NEGATIVE

## 2022-04-10 MED ORDER — MECLIZINE HCL 25 MG PO TABS
12.5000 mg | ORAL_TABLET | Freq: Once | ORAL | Status: AC
  Administered 2022-04-10: 12.5 mg via ORAL
  Filled 2022-04-10: qty 1

## 2022-04-10 MED ORDER — MECLIZINE HCL 12.5 MG PO TABS: 12.5000 mg | ORAL_TABLET | Freq: Three times a day (TID) | ORAL | 0 refills | Status: AC | PRN

## 2022-04-10 NOTE — Discharge Instructions (Addendum)
Return for any problem.  ?

## 2022-04-10 NOTE — ED Triage Notes (Addendum)
Pt here from home with c/o dizziness that has been ongoing for the last few months , recent  started working out , has also had some sinus issues that he has been using flonase for

## 2022-04-10 NOTE — ED Provider Notes (Signed)
[facility] Provider Note   CSN: [id number] Arrival date & time: 04/10/22  1246     History  Chief Complaint  Patient presents with   Dizziness    Bryan Allen is a 52 y.o. male.  52 year old male with prior medical history as detailed below presents for evaluation.  Patient reports intermittent episodes of vertiginous symptoms.  These been ongoing for the last several years.  Patient reports longer than normal episode yesterday while he was driving.  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  He reports feeling like the " world was spinning around him" - symptoms lasted less than 5 minutes.  Episode was not associated with chest pain, shortness of breath, visual changes, focal weakness, other complaint.  The history is provided by the patient and medical records.       Home Medications Prior to Admission medications   Medication Sig Start Date End Date Taking? Authorizing Provider  fluticasone (FLONASE) 50 MCG/ACT nasal spray Place 1 spray into both nostrils daily. OK to increase to twice a day if needed. 03/31/22   Jeanie Sewer, NP  lidocaine (HM LIDOCAINE PATCH) 4 % Place 1 patch onto the skin every 12 (twelve) hours as needed. Patient not taking: Reported on 03/30/2022 07/11/21   Loni Beckwith, PA-C      Allergies    Other    Review of Systems   Review of Systems  All other systems reviewed and are negative.   Physical Exam Updated Vital Signs BP (!) 136/96   Pulse 87   Temp 98.3 F (36.8 C) (Oral)   Resp 18   SpO2 99%  Physical Exam Vitals and nursing note reviewed.  Constitutional:      General: He is not in acute distress.    Appearance: Normal appearance. He is well-developed.  HENT:     Head: Normocephalic and atraumatic.  Eyes:     Conjunctiva/sclera: Conjunctivae normal.     Pupils: Pupils are equal, round, and reactive to light.  Cardiovascular:     Rate and Rhythm:  Normal rate and regular rhythm.     Heart sounds: Normal heart sounds.  Pulmonary:     Effort: Pulmonary effort is normal. No respiratory distress.     Breath sounds: Normal breath sounds.  Abdominal:     General: There is no distension.     Palpations: Abdomen is soft.     Tenderness: There is no abdominal tenderness.  Musculoskeletal:        General: No deformity. Normal range of motion.     Cervical back: Normal range of motion and neck supple.  Skin:    General: Skin is warm and dry.  Neurological:     General: No focal deficit present.     Mental Status: He is alert and oriented to person, place, and time. Mental status is at baseline.     Cranial Nerves: No cranial nerve deficit.     Sensory: No sensory deficit.     Motor: No weakness.     Coordination: Coordination normal.     ED Results / Procedures / Treatments   Labs (all labs ordered are listed, but only abnormal results are displayed) Labs Reviewed  RESP PANEL BY RT-PCR (RSV, FLU A&B, COVID)  RVPGX2    EKG None  Radiology No results found.  Procedures Procedures    Medications Ordered in ED Medications  meclizine (ANTIVERT) tablet 12.5 mg (has no administration in  time range)    ED Course/ Medical Decision Making/ A&P                             Medical Decision Making   Medical Screen Complete  This patient presented to the ED with complaint of vertiginous symptoms.  This complaint involves an extensive number of treatment options. The initial differential diagnosis includes, but is not limited to, transient intermittent benign positional vertigo  This presentation is: Acute, Chronic, Self-Limited, Previously Undiagnosed, Uncertain Prognosis, and Complicated  Patient is presenting with complaint of intermittent brief periods of benign positional vertigo.  Patient without evidence on history or exam of other significant pathology.  Patient reassured by ED evaluation.  Patient is agreeable  with plan for outpatient follow-up regarding his symptoms.  Patient given a prescription for meclizine to try if his symptoms persisted for longer than just a few minutes at a time.  Importance of close follow-up is stressed.  Strict return precautions given and understood.  Problem List / ED Course:  Vertigo, transient   Reevaluation:  After the interventions noted above, I reevaluated the patient and found that they have: resolved  Disposition:  After consideration of the diagnostic results and the patients response to treatment, I feel that the patent would benefit from close outpatient follow-up.          Final Clinical Impression(s) / ED Diagnoses Final diagnoses:  Vertigo    Rx / DC Orders ED Discharge Orders          Ordered    meclizine (ANTIVERT) 12.5 MG tablet  3 times daily PRN        04/10/22 1411              Valarie Merino, MD 04/10/22 1556

## 2022-04-12 ENCOUNTER — Ambulatory Visit (INDEPENDENT_AMBULATORY_CARE_PROVIDER_SITE_OTHER): Payer: Federal, State, Local not specified - PPO

## 2022-04-12 DIAGNOSIS — R7309 Other abnormal glucose: Secondary | ICD-10-CM

## 2022-04-12 LAB — POCT GLYCOSYLATED HEMOGLOBIN (HGB A1C): Hemoglobin A1C: 5.4 % (ref 4.0–5.6)

## 2022-04-12 NOTE — Progress Notes (Signed)
POC A1c was done on patient today. Joanette Gula, CMA

## 2022-04-13 NOTE — Progress Notes (Signed)
Your A1C to check for diabetes is in normal range as you can see. Great news!

## 2022-05-09 ENCOUNTER — Ambulatory Visit: Payer: Federal, State, Local not specified - PPO | Admitting: Family

## 2022-05-26 ENCOUNTER — Encounter: Payer: Self-pay | Admitting: Family

## 2022-05-27 ENCOUNTER — Ambulatory Visit: Payer: Self-pay | Admitting: Internal Medicine

## 2022-08-08 ENCOUNTER — Ambulatory Visit: Payer: Self-pay | Admitting: Internal Medicine

## 2022-10-05 ENCOUNTER — Ambulatory Visit: Payer: Self-pay | Admitting: Internal Medicine

## 2022-10-26 DIAGNOSIS — Z1330 Encounter for screening examination for mental health and behavioral disorders, unspecified: Secondary | ICD-10-CM | POA: Diagnosis not present

## 2022-10-26 DIAGNOSIS — Z0000 Encounter for general adult medical examination without abnormal findings: Secondary | ICD-10-CM | POA: Diagnosis not present

## 2022-10-26 DIAGNOSIS — Z6835 Body mass index (BMI) 35.0-35.9, adult: Secondary | ICD-10-CM | POA: Diagnosis not present

## 2023-01-25 DIAGNOSIS — R972 Elevated prostate specific antigen [PSA]: Secondary | ICD-10-CM | POA: Diagnosis not present

## 2023-02-02 DIAGNOSIS — F419 Anxiety disorder, unspecified: Secondary | ICD-10-CM | POA: Diagnosis not present

## 2023-02-02 DIAGNOSIS — J301 Allergic rhinitis due to pollen: Secondary | ICD-10-CM | POA: Diagnosis not present

## 2023-02-02 DIAGNOSIS — R42 Dizziness and giddiness: Secondary | ICD-10-CM | POA: Diagnosis not present

## 2023-02-02 DIAGNOSIS — H6123 Impacted cerumen, bilateral: Secondary | ICD-10-CM | POA: Diagnosis not present

## 2023-04-30 ENCOUNTER — Emergency Department (HOSPITAL_BASED_OUTPATIENT_CLINIC_OR_DEPARTMENT_OTHER)

## 2023-04-30 ENCOUNTER — Encounter (HOSPITAL_BASED_OUTPATIENT_CLINIC_OR_DEPARTMENT_OTHER): Payer: Self-pay | Admitting: Emergency Medicine

## 2023-04-30 ENCOUNTER — Emergency Department (HOSPITAL_BASED_OUTPATIENT_CLINIC_OR_DEPARTMENT_OTHER)
Admission: EM | Admit: 2023-04-30 | Discharge: 2023-04-30 | Disposition: A | Discharge status: Home / Self Care | Attending: Emergency Medicine | Admitting: Emergency Medicine

## 2023-04-30 DIAGNOSIS — R519 Headache, unspecified: Secondary | ICD-10-CM | POA: Diagnosis present

## 2023-04-30 DIAGNOSIS — R0602 Shortness of breath: Secondary | ICD-10-CM | POA: Diagnosis not present

## 2023-04-30 DIAGNOSIS — R059 Cough, unspecified: Secondary | ICD-10-CM | POA: Diagnosis not present

## 2023-04-30 LAB — RESP PANEL BY RT-PCR (RSV, FLU A&B, COVID)  RVPGX2
Influenza A by PCR: NEGATIVE
Influenza B by PCR: NEGATIVE
Resp Syncytial Virus by PCR: NEGATIVE
SARS Coronavirus 2 by RT PCR: NEGATIVE

## 2023-04-30 MED ORDER — ACETAMINOPHEN 325 MG PO TABS
650.0000 mg | ORAL_TABLET | Freq: Once | ORAL | Status: AC
  Administered 2023-04-30: 650 mg via ORAL
  Filled 2023-04-30: qty 2

## 2023-04-30 MED ORDER — DEXAMETHASONE 4 MG PO TABS
10.0000 mg | ORAL_TABLET | Freq: Once | ORAL | Status: AC
  Administered 2023-04-30: 10 mg via ORAL
  Filled 2023-04-30: qty 3

## 2023-04-30 NOTE — ED Notes (Signed)
 Pt given discharge instructions. Opportunities given for questions. Pt verbalizes understanding. Jillyn Hidden, RN

## 2023-04-30 NOTE — ED Provider Notes (Signed)
 [place] EMERGENCY DEPARTMENT AT [facility] Provider Note   CSN: [id number] Arrival date & time: 04/30/23  4098     History  Chief Complaint  Patient presents with   Shortness of Breath   Headache    Bryan Allen is a 53 y.o. male.  Patient here with cough shortness of breath headache.  States that seems to be triggered by people who smoke at his job.  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  He denies any fever body aches.  He is not having any major headache at this time.  Does not feel short of breath at this time.  He thinks that he is having maybe a little reaction to the exposure.  He also has some seasonal allergies.  Multiple weather changes recently may be playing a role.  He denies any chest pain leg swelling.  No recent surgery or travel.  No history of diabetes hypertension or high cholesterol.  The history is provided by the patient.       Home Medications Prior to Admission medications   Medication Sig Start Date End Date Taking? Authorizing Provider  fluticasone (FLONASE) 50 MCG/ACT nasal spray Place 1 spray into both nostrils daily. OK to increase to twice a day if needed. 03/31/22   Dulce Sellar, NP  lidocaine (HM LIDOCAINE PATCH) 4 % Place 1 patch onto the skin every 12 (twelve) hours as needed. Patient not taking: Reported on 03/30/2022 07/11/21   Haskel Schroeder, PA-C  meclizine (ANTIVERT) 12.5 MG tablet Take 1 tablet (12.5 mg total) by mouth 3 (three) times daily as needed for dizziness. 04/10/22   Wynetta Fines, MD      Allergies    Other    Review of Systems   Review of Systems  Physical Exam Updated Vital Signs BP 124/87 (BP Location: Right Arm)   Pulse 93   Temp 98.3 F (36.8 C) (Oral)   Resp 18   Ht 5\' 8"  (1.727 m)   Wt 111.1 kg   SpO2 96%   BMI 37.25 kg/m  Physical Exam Vitals and nursing note reviewed.  Constitutional:      General: He is not in acute distress.    Appearance: He is well-developed.   HENT:     Head: Normocephalic and atraumatic.     Mouth/Throat:     Mouth: Mucous membranes are moist.  Eyes:     Extraocular Movements: Extraocular movements intact.     Conjunctiva/sclera: Conjunctivae normal.     Pupils: Pupils are equal, round, and reactive to light.  Cardiovascular:     Rate and Rhythm: Normal rate and regular rhythm.     Pulses: Normal pulses.     Heart sounds: Normal heart sounds. No murmur heard. Pulmonary:     Effort: Pulmonary effort is normal. No respiratory distress.     Breath sounds: Normal breath sounds. No decreased breath sounds or wheezing.  Abdominal:     Palpations: Abdomen is soft.     Tenderness: There is no abdominal tenderness.  Musculoskeletal:        General: No swelling.     Cervical back: Normal range of motion and neck supple.     Right lower leg: No edema.     Left lower leg: No edema.  Skin:    General: Skin is warm and dry.     Capillary Refill: Capillary refill takes less than 2 seconds.  Neurological:     General: No focal deficit present.  Mental Status: He is alert.  Psychiatric:        Mood and Affect: Mood normal.     ED Results / Procedures / Treatments   Labs (all labs ordered are listed, but only abnormal results are displayed) Labs Reviewed  RESP PANEL BY RT-PCR (RSV, FLU A&B, COVID)  [id number]    EKG EKG Interpretation Date/Time:  Sunday April 30 2023 08:23:55 EST Ventricular Rate:  81 PR Interval:  160 QRS Duration:  83 QT Interval:  358 QTC Calculation: 416 R Axis:   86  Text Interpretation: Sinus rhythm Confirmed by Virgina Norfolk [phone]) on 04/30/2023 8:42:51 AM  Radiology DG Chest Portable 1 View Result Date: 04/30/2023 CLINICAL DATA:  Shortness of breath. EXAM: PORTABLE CHEST 1 VIEW COMPARISON:  06/09/2017 FINDINGS: Heart size and mediastinal contours are unremarkable. No pleural fluid, interstitial edema or airspace disease. Visualized osseous structures are unremarkable. IMPRESSION: No active  disease. Electronically Signed   By: Signa Kell M.D.   On: 04/30/2023 09:03    Procedures Procedures    Medications Ordered in ED Medications  acetaminophen (TYLENOL) tablet 650 mg (650 mg Oral Given 04/30/23 0836)  dexamethasone (DECADRON) tablet 10 mg (10 mg Oral Given 04/30/23 6045)    ED Course/ Medical Decision Making/ A&P                                 Medical Decision Making Amount and/or Complexity of Data Reviewed Radiology: ordered.  Risk OTC drugs. Prescription drug management.   [name] is here mostly for cough.  Normal vitals.  No fever.  EKG shows sinus rhythm.  No ischemic changes per my review interpretation.  Very well-appearing.  No signs of volume overload on exam.  He has no PE or CAD risk factors.  Have no concern for ACS PE.  Wells criteria negative for PE.  EKG reassuring.  No ischemic changes.  No chest pain.  No concern for ACS.  He is having maybe GU versus viral type symptoms.  He attributes his symptoms secondary to smoke exposure at work as people smoke cigarettes around him.  Overall is feeling better now that he is out of that environment.  Chest x-ray obtained showed no evidence of pneumonia or pneumothorax per my review and interpretation.  COVID flu RSV swab has been obtained which she will follow-up on his MyChart.  Symptomatic we will treat him with Decadron and Tylenol.  I do suspect this could be allergy related.  However no concern for anaphylaxis or other emergent process.  Discharged in good condition.  Understands return precautions.  This chart was dictated using voice recognition software.  Despite best efforts to proofread,  errors can occur which can change the documentation meaning.         Final Clinical Impression(s) / ED Diagnoses Final diagnoses:  Cough, unspecified type    Rx / DC Orders ED Discharge Orders     None         Virgina Norfolk, DO 04/30/23 0908

## 2023-04-30 NOTE — ED Triage Notes (Signed)
 Pt c/o SOB and headache that started yesterday. Pt states he is around a lot of cigarette smoking at work and "may be allergic".

## 2023-04-30 NOTE — Discharge Instructions (Signed)
 You can follow-up your COVID flu RSV test on your MyChart.

## 2023-06-24 ENCOUNTER — Encounter (HOSPITAL_BASED_OUTPATIENT_CLINIC_OR_DEPARTMENT_OTHER): Payer: Self-pay | Admitting: Emergency Medicine

## 2023-06-24 ENCOUNTER — Emergency Department (HOSPITAL_BASED_OUTPATIENT_CLINIC_OR_DEPARTMENT_OTHER)
Admission: EM | Admit: 2023-06-24 | Discharge: 2023-06-24 | Disposition: A | Discharge status: Home / Self Care | Attending: Emergency Medicine | Admitting: Emergency Medicine

## 2023-06-24 ENCOUNTER — Other Ambulatory Visit: Payer: Self-pay

## 2023-06-24 DIAGNOSIS — J029 Acute pharyngitis, unspecified: Secondary | ICD-10-CM | POA: Diagnosis present

## 2023-06-24 LAB — GROUP A STREP BY PCR: Group A Strep by PCR: NOT DETECTED

## 2023-06-24 NOTE — ED Triage Notes (Signed)
Sore throat for 4 days

## 2023-06-24 NOTE — ED Provider Notes (Signed)
 [place] EMERGENCY DEPARTMENT AT [facility] Provider Note   CSN: [id number] Arrival date & time: 06/24/23  9562     History  No chief complaint on file.   Bryan Allen is a 53 y.o. male.  Patient is a 53 year old male with a history of no significant medical problems who is presenting today with a sore throat for the last 4 days.  It is mostly been on the right and has been gradually improving but has still not gone away completely.  He has not had cough, congestion, fever.  No difficulty swallowing or eating.  No voice changes.  No known sick contacts.  The history is provided by the patient.       Home Medications Prior to Admission medications   Medication Sig Start Date End Date Taking? Authorizing Provider  fluticasone  (FLONASE ) 50 MCG/ACT nasal spray Place 1 spray into both nostrils daily. OK to increase to twice a day if needed. 03/31/22   Versa Gore, NP  lidocaine  (HM LIDOCAINE  PATCH) 4 % Place 1 patch onto the skin every 12 (twelve) hours as needed. Patient not taking: Reported on 03/30/2022 07/11/21   Marshal Skeens, PA-C  meclizine  (ANTIVERT ) 12.5 MG tablet Take 1 tablet (12.5 mg total) by mouth 3 (three) times daily as needed for dizziness. 04/10/22   Burnette Carte, MD      Allergies    Other    Review of Systems   Review of Systems  Physical Exam Updated Vital Signs Pulse 80   Temp 98 F (36.7 C) (Oral)   Resp 20   Wt 111.1 kg   SpO2 96%   BMI 37.25 kg/m  Physical Exam Vitals and nursing note reviewed.  HENT:     Head: Normocephalic.     Right Ear: Tympanic membrane normal.     Left Ear: Tympanic membrane normal.     Nose: Nose normal.     Mouth/Throat:     Pharynx: Posterior oropharyngeal erythema present. No oropharyngeal exudate.  Eyes:     Pupils: Pupils are equal, round, and reactive to light.  Cardiovascular:     Rate and Rhythm: Normal rate.  Pulmonary:     Effort: Pulmonary effort is normal.   Musculoskeletal:     Cervical back: Normal range of motion.  Neurological:     Mental Status: He is alert.     ED Results / Procedures / Treatments   Labs (all labs ordered are listed, but only abnormal results are displayed) Labs Reviewed  GROUP A STREP BY PCR    EKG None  Radiology No results found.  Procedures Procedures    Medications Ordered in ED Medications - No data to display  ED Course/ Medical Decision Making/ A&P                                 Medical Decision Making  Patient here complaining of sore throat.  Otherwise well-appearing.  No significant cervical adenopathy and no exudates on exam.  Mild erythema in the throat and suspect most likely viral etiology.  Strep test was negative.  Symptoms are starting to improve and low suspicion for epiglottitis, PTA, RPA or dental problems.        Final Clinical Impression(s) / ED Diagnoses Final diagnoses:  Acute viral pharyngitis    Rx / DC Orders ED Discharge Orders     None  Almond Army, MD 06/24/23 1110

## 2023-06-24 NOTE — Discharge Instructions (Signed)
 There is no sign of strep throat today.  This is probably something viral and should go away in the next few days.

## 2023-07-10 ENCOUNTER — Emergency Department (HOSPITAL_BASED_OUTPATIENT_CLINIC_OR_DEPARTMENT_OTHER)

## 2023-07-10 ENCOUNTER — Other Ambulatory Visit: Payer: Self-pay

## 2023-07-10 ENCOUNTER — Emergency Department (HOSPITAL_BASED_OUTPATIENT_CLINIC_OR_DEPARTMENT_OTHER)
Admission: EM | Admit: 2023-07-10 | Discharge: 2023-07-10 | Disposition: A | Discharge status: Home / Self Care | Attending: Emergency Medicine | Admitting: Emergency Medicine

## 2023-07-10 ENCOUNTER — Encounter (HOSPITAL_BASED_OUTPATIENT_CLINIC_OR_DEPARTMENT_OTHER): Payer: Self-pay | Admitting: Emergency Medicine

## 2023-07-10 DIAGNOSIS — K819 Cholecystitis, unspecified: Secondary | ICD-10-CM | POA: Insufficient documentation

## 2023-07-10 DIAGNOSIS — R109 Unspecified abdominal pain: Secondary | ICD-10-CM | POA: Diagnosis present

## 2023-07-10 DIAGNOSIS — D135 Benign neoplasm of extrahepatic bile ducts: Secondary | ICD-10-CM

## 2023-07-10 LAB — COMPREHENSIVE METABOLIC PANEL WITH GFR
ALT: 40 U/L (ref 0–44)
AST: 33 U/L (ref 15–41)
Albumin: 4.6 g/dL (ref 3.5–5.0)
Alkaline Phosphatase: 50 U/L (ref 38–126)
Anion gap: 11 (ref 5–15)
BUN: 15 mg/dL (ref 6–20)
CO2: 22 mmol/L (ref 22–32)
Calcium: 9.6 mg/dL (ref 8.9–10.3)
Chloride: 105 mmol/L (ref 98–111)
Creatinine, Ser: 0.99 mg/dL (ref 0.61–1.24)
GFR, Estimated: >60 mL/min (ref >60)
Glucose, Bld: 89 mg/dL (ref 70–99)
Potassium: 4.2 mmol/L (ref 3.5–5.1)
Sodium: 138 mmol/L (ref 135–145)
Total Bilirubin: 0.3 mg/dL (ref 0.0–1.2)
Total Protein: 7.3 g/dL (ref 6.5–8.1)

## 2023-07-10 LAB — CBC
HCT: 41.1 % (ref 39.0–52.0)
Hemoglobin: 14.4 g/dL (ref 13.0–17.0)
MCH: 29.8 pg (ref 26.0–34.0)
MCHC: 35 g/dL (ref 30.0–36.0)
MCV: 85.1 fL (ref 80.0–100.0)
Platelets: 175 10*3/uL (ref 150–400)
RBC: 4.83 MIL/uL (ref 4.22–5.81)
RDW: 13 % (ref 11.5–15.5)
WBC: 4.6 10*3/uL (ref 4.0–10.5)
nRBC: 0 % (ref 0.0–0.2)

## 2023-07-10 LAB — LIPASE, BLOOD: Lipase: 23 U/L (ref 11–51)

## 2023-07-10 LAB — URINALYSIS, ROUTINE W REFLEX MICROSCOPIC
Bilirubin Urine: NEGATIVE
Glucose, UA: NEGATIVE mg/dL
Hgb urine dipstick: NEGATIVE
Ketones, ur: NEGATIVE mg/dL
Leukocytes,Ua: NEGATIVE
Nitrite: NEGATIVE
Protein, ur: NEGATIVE mg/dL
Specific Gravity, Urine: 1.023 (ref 1.005–1.030)
pH: 6 (ref 5.0–8.0)

## 2023-07-10 MED ORDER — ONDANSETRON HCL 4 MG/2ML IJ SOLN
4.0000 mg | Freq: Once | INTRAMUSCULAR | Status: DC
  Filled 2023-07-10: qty 2

## 2023-07-10 MED ORDER — MORPHINE SULFATE (PF) 4 MG/ML IV SOLN
4.0000 mg | Freq: Once | INTRAVENOUS | Status: DC
  Filled 2023-07-10: qty 1

## 2023-07-10 NOTE — ED Provider Notes (Signed)
 [place] EMERGENCY DEPARTMENT AT [facility] Provider Note   CSN: [id number] Arrival date & time: 07/10/23  1314     History  Chief Complaint  Patient presents with   Abdominal Pain    Bryan Allen is a 53 y.o. male.  With past medical history of obstructive sleep apnea presenting to emergency room with complaint of abdominal pain.  Patient reported this started 2 days ago and has been constant.  It is located in right upper quadrant.  He says it is worse when he is sitting up.  Has no associated nausea vomiting or diarrhea.  He has no fever.  Reports he has had similar symptoms in the past that seem to come and go.  He has no prior abdominal surgeries.  Still has gallbladder and still has appendix.  He has had normal bowel movement, no blood in stool. Denies CP, SOB, cough.    Abdominal Pain      Home Medications Prior to Admission medications   Medication Sig Start Date End Date Taking? Authorizing Provider  fluticasone  (FLONASE ) 50 MCG/ACT nasal spray Place 1 spray into both nostrils daily. OK to increase to twice a day if needed. 03/31/22   Versa Gore, NP  lidocaine  (HM LIDOCAINE  PATCH) 4 % Place 1 patch onto the skin every 12 (twelve) hours as needed. Patient not taking: Reported on 03/30/2022 07/11/21   Marshal Skeens, PA-C  meclizine  (ANTIVERT ) 12.5 MG tablet Take 1 tablet (12.5 mg total) by mouth 3 (three) times daily as needed for dizziness. 04/10/22   Burnette Carte, MD      Allergies    Other    Review of Systems   Review of Systems  Gastrointestinal:  Positive for abdominal pain.    Physical Exam Updated Vital Signs BP (!) 139/95 (BP Location: Right Arm)   Pulse 82   Temp 98.3 F (36.8 C)   Resp 16   SpO2 97%  Physical Exam Vitals and nursing note reviewed.  Constitutional:      General: He is not in acute distress.    Appearance: He is not toxic-appearing.  HENT:     Head: Normocephalic and atraumatic.  Eyes:      General: No scleral icterus.    Conjunctiva/sclera: Conjunctivae normal.  Cardiovascular:     Rate and Rhythm: Normal rate and regular rhythm.     Pulses: Normal pulses.     Heart sounds: Normal heart sounds.  Pulmonary:     Effort: Pulmonary effort is normal. No respiratory distress.     Breath sounds: Normal breath sounds.  Abdominal:     General: Abdomen is flat. Bowel sounds are normal.     Palpations: Abdomen is soft.     Tenderness: There is abdominal tenderness.     Comments: TTP in RUQ  Musculoskeletal:     Right lower leg: No edema.     Left lower leg: No edema.  Skin:    General: Skin is warm and dry.     Findings: No lesion.  Neurological:     General: No focal deficit present.     Mental Status: He is alert and oriented to person, place, and time. Mental status is at baseline.     ED Results / Procedures / Treatments   Labs (all labs ordered are listed, but only abnormal results are displayed) Labs Reviewed  LIPASE, BLOOD  COMPREHENSIVE METABOLIC PANEL WITH GFR  CBC  URINALYSIS, ROUTINE W REFLEX MICROSCOPIC    EKG None  Radiology US  Abdomen Limited RUQ (LIVER/GB) Result Date: 07/10/2023 CLINICAL DATA:  Right upper quadrant pain EXAM: ULTRASOUND ABDOMEN LIMITED RIGHT UPPER QUADRANT COMPARISON:  None Available. FINDINGS: Gallbladder: Gallbladder is well distended. Gallbladder sludge is seen. Negative sonographic Murphy's sign is elicited. Echogenic foci within the gallbladder wall with ring down artifact are noted consistent with adenomyomatosis. Common bile duct: Diameter: 5.4 mm. Liver: Increased in attenuation consistent with fatty infiltration. No focal mass is noted. Portal vein is patent on color Doppler imaging with normal direction of blood flow towards the liver. Other: None. IMPRESSION: Fatty liver. Gallbladder sludge without cholelithiasis. Findings suggestive of adenomyomatosis. Electronically Signed   By: Violeta Grey M.D.   On: 07/10/2023 19:03     Procedures Procedures    Medications Ordered in ED Medications - No data to display  ED Course/ Medical Decision Making/ A&P                                 Medical Decision Making Amount and/or Complexity of Data Reviewed Labs: ordered. Radiology: ordered.  Risk Prescription drug management.   This patient presents to the ED for concern of abd pain, this involves an extensive number of treatment options, and is a complaint that carries with it a high risk of complications and morbidity.  The differential diagnosis includes appendicitis, cholecystitis, choledocholithiasis, small bowel obstruction, constipation, urinary tract infection, dehydration, electrolyte abnormality    Lab Tests:  I personally interpreted labs.  The pertinent results include:  CBC, BMP, Lipase and UA wnl   Imaging Studies ordered:  I ordered imaging studies including RUQ US    I independently visualized and interpreted imaging which showed no acute findings consistent with cholecystitis I agree with the radiologist interpretation   Cardiac Monitoring: / EKG:  The patient was maintained on a cardiac monitor.     Problem List / ED Course / Critical interventions / Medication management  Patient reporting to emergency room with complaint of abdominal pain.  This has been present for approximately 2 days.  He reports has had similar intermittent symptoms like this in the past.  Abdominal pain does not radiate.  He has no associated nausea vomiting or diarrhea.  He is tolerating oral intake.  He has moist mucous membranes.  Vital signs are stable and he is very well-appearing.  CBC without leukocytosis and he has no significant anemia.  CMP with normal liver and kidney function.  Lipase within normal limits.  No urinary tract infection.  Given his continued right upper quadrant pain will get right upper quadrant ultrasound to rule out acute cholecystitis.  Will give morphine and Zofran  for pain and  nausea control then reassess. Patient is feeling better after receiving no medications in the emergency room.  Workup is overall reassuring.  Will have him follow-up with GI.  Hemodynamically stable and appropriate for discharge, tolerating oral intake. I ordered medication including morphine, zofran   Reevaluation of the patient after these medicines showed that the patient improved I have reviewed the patients home medicines and have made adjustments as needed   Plan  F/u w/ PCP in 2-3d to ensure resolution of sx.  Patient was given return precautions. Patient stable for discharge at this time.  Patient educated on sx/dx and verbalized understanding of plan. Return to ER w/ new or worsening sx.          Final Clinical Impression(s) / ED Diagnoses Final diagnoses:  None  Rx / DC Orders ED Discharge Orders     None         Suzanne Erps Winson Eichorn N, PA-C 07/10/23 2149    Arvilla Birmingham, MD 07/10/23 [id number]

## 2023-07-10 NOTE — ED Triage Notes (Addendum)
 RUQ pain. 2 days. Last BM today. -N/-V/-D. No daily meds, denies medical Hx. Briefly felt better after self admi enema at home.

## 2023-07-10 NOTE — Discharge Instructions (Signed)
 You are seen in the emergency room today for abdominal pain.  Overall your lab work and imaging is reassuring.  No sign of infection of your gallbladder but you do have a finding consistent with abnormal shape of gallbladder.  Please follow-up with GI for further evaluation of symptoms.  Return to emergency room with worsening or severe pain, fever nausea vomiting or diarrhea.

## 2024-02-04 ENCOUNTER — Emergency Department (HOSPITAL_BASED_OUTPATIENT_CLINIC_OR_DEPARTMENT_OTHER): Admission: EM | Admit: 2024-02-04 | Discharge: 2024-02-04 | Discharge status: Against Medical Advice

## 2024-02-04 ENCOUNTER — Other Ambulatory Visit: Payer: Self-pay

## 2024-02-25 ENCOUNTER — Emergency Department (HOSPITAL_BASED_OUTPATIENT_CLINIC_OR_DEPARTMENT_OTHER)
Admission: EM | Admit: 2024-02-25 | Discharge: 2024-02-25 | Disposition: A | Discharge status: Home / Self Care | Attending: Emergency Medicine | Admitting: Emergency Medicine

## 2024-02-25 ENCOUNTER — Emergency Department (HOSPITAL_BASED_OUTPATIENT_CLINIC_OR_DEPARTMENT_OTHER)

## 2024-02-25 ENCOUNTER — Encounter (HOSPITAL_BASED_OUTPATIENT_CLINIC_OR_DEPARTMENT_OTHER): Payer: Self-pay

## 2024-02-25 DIAGNOSIS — S6991XA Unspecified injury of right wrist, hand and finger(s), initial encounter: Secondary | ICD-10-CM | POA: Diagnosis present

## 2024-02-25 DIAGNOSIS — Z79899 Other long term (current) drug therapy: Secondary | ICD-10-CM | POA: Diagnosis not present

## 2024-02-25 DIAGNOSIS — X509XXA Other and unspecified overexertion or strenuous movements or postures, initial encounter: Secondary | ICD-10-CM | POA: Diagnosis not present

## 2024-02-25 DIAGNOSIS — S63612A Unspecified sprain of right middle finger, initial encounter: Secondary | ICD-10-CM | POA: Insufficient documentation

## 2024-02-25 NOTE — ED Triage Notes (Signed)
 He tells me that, as he was pulling strongly to pull on a boot, he felt a pop in right middle finger. He is here today with persistent pain, swelling and stiffness of his right middle finger.

## 2024-02-25 NOTE — Discharge Instructions (Addendum)
 It appears that you have sprained one of the tendons in your right middle finger.  Your x-ray did not show any fracture or dislocation.  Please wear the finger splint provided at all times for the next week aside from showering and washing your hands.  This should allow time for your tendon to start healing.  If you are no longer having pain in 1 week, you may stop using the finger brace.  Avoid movements and activities that are painful in the first couple of days after the injury. Elevate the area of injury if able to help limit swelling. Apply ice to the area for the first 24 hours, then switch to heat.   You may take up to 1000mg  of tylenol  every 6 hours as needed for pain. Do not take more then 4g per day.  You may use up to 600mg  ibuprofen  every 6 hours as needed for pain.  Do not exceed 2.4g of ibuprofen  per day.   Gradually return to activity as pain allows. Try to engage in non-painful types of physical activity/exercise to increase blood flow to your area of injury.  If your pain does not start to improve over the next week, please schedule follow-up appointment with the hand orthopedic doctor, Dr. Alyse, listed below.   Return to the ER for any numbness, uncontrolled pain, any other new or concerning symptoms

## 2024-02-25 NOTE — ED Provider Notes (Signed)
 " [place] [facility] Provider Note   CSN: [id number] Arrival date & time: 02/25/24  0901     Patient presents with: Hand Pain   Bryan Allen is a 53 y.o. male with a significant past medical history presents with concern for right middle finger pain that started a couple days ago after pulling his shoe on with that finger.  He reports he felt a pop in his right middle finger and then started to have pain.  He reports that it feels more difficult to fully bend his finger.  Denies any paresthesias in the finger.  He is right-handed.    Hand Pain       Prior to Admission medications  Medication Sig Start Date End Date Taking? Authorizing Provider  fluticasone  (FLONASE ) 50 MCG/ACT nasal spray Place 1 spray into both nostrils daily. OK to increase to twice a day if needed. 03/31/22   Lucius Krabbe, NP  lidocaine  (HM LIDOCAINE  PATCH) 4 % Place 1 patch onto the skin every 12 (twelve) hours as needed. Patient not taking: Reported on 03/30/2022 07/11/21   Eudelia Maude SAUNDERS, PA-C  meclizine  (ANTIVERT ) 12.5 MG tablet Take 1 tablet (12.5 mg total) by mouth 3 (three) times daily as needed for dizziness. 04/10/22   Laurice Maude BROCKS, MD    Allergies: Other    Review of Systems  Musculoskeletal:        Right middle finger pain    Updated Vital Signs BP (!) 144/89 (BP Location: Left Arm)   Pulse 83   Temp 98.6 F (37 C)   Resp 16   Ht 5' 8 (1.727 m)   Wt 108.9 kg   SpO2 97%   BMI 36.49 kg/m   Physical Exam Vitals and nursing note reviewed.  Constitutional:      Appearance: Normal appearance.  HENT:     Head: Atraumatic.  Cardiovascular:     Comments: Brisk cap refill of the right middle finger Pulmonary:     Effort: Pulmonary effort is normal.  Musculoskeletal:     Comments: Right middle finger  General No erythema, edema, contusions, open wounds   Palpation Tender over the PIP joint of the right middle finger and over the  tendon sheath on the palmar aspect of the PIP joint Non-tender over the proximal, middle, or distal phalanx  ROM Full flexion and extension at the wrist Full flexion and extension at the MCP, PIP, DIP  Sensation: Sensation intact throughout the finger   Neurological:     General: No focal deficit present.     Mental Status: He is alert.  Psychiatric:        Mood and Affect: Mood normal.        Behavior: Behavior normal.     (all labs ordered are listed, but only abnormal results are displayed) Labs Reviewed - No data to display  EKG: None  Radiology: DG Finger Middle Right Result Date: 02/25/2024 CLINICAL DATA:  Status post trauma. EXAM: RIGHT MIDDLE FINGER 2+V COMPARISON:  None Available. FINDINGS: There is no evidence of fracture or dislocation. There is no evidence of arthropathy or other focal bone abnormality. Mild soft tissue swelling is seen along the PIP joint. IMPRESSION: Mild soft tissue swelling without evidence of acute fracture or dislocation. Electronically Signed   By: Suzen Dials M.D.   On: 02/25/2024 11:19     Procedures   Medications Ordered in the ED - No data to display  Medical Decision Making Amount and/or Complexity of Data Reviewed Radiology: ordered.    Differential diagnosis includes but is not limited to fracture, dislocation, sprain, strain, contusion, laceration, nerve injury, vascular injury, compartment syndrome  ED Course:  Upon initial evaluation, patient is well-appearing, no acute distress.  He is having pain to the right middle finger particularly over the PIP joint.  On exam, he is point tender in the PIP joint and over the flexor tendon sheath in this area.  He does not have any point tenderness over the proximal, middle, or distal phalanx.  He has full range of motion at the MCP, PIP, and DIP joint of the right middle finger.  He has brisk cap refill in the right middle finger and has intact  sensation.  His x-ray of the right middle finger does not show any acute fracture or dislocation.  Given the mechanism of injury and location of pain, suspect he has a partial tendon tear or strain.  He was placed into a finger splint.  Stable and appropriate for discharge home    Imaging Studies ordered: I ordered imaging studies including x-ray right middle finger I independently visualized the imaging with scope of interpretation limited to determining acute life threatening conditions related to emergency care. Imaging showed  IMPRESSION:  Mild soft tissue swelling without evidence of acute fracture or  dislocation.   I agree with the radiologist interpretation   Medications Given: None  Impression: Right middle finger flexor tendon strain  Disposition:  Patient discharged home with instructions to wear his finger splint provided at all times for the next week.  May take off to shower and wash hands.  Tylenol  and ibuprofen  as needed for pain.  Follow-up with Dr. Alyse with hand orthopedics if pain not starting to improve within the next week. Return precautions given and patient verbalized understanding.   This chart was dictated using voice recognition software, Dragon. Despite the best efforts of this provider to proofread and correct errors, errors may still occur which can change documentation meaning.       Final diagnoses:  Sprain of right middle finger, initial encounter    ED Discharge Orders     None          Veta Palma, DEVONNA 02/25/24 1135    Rogelia Jerilynn RAMAN, MD 02/25/24 [phone]  "
